# Patient Record
Sex: Female | Born: 1955
Health system: Southern US, Community
[De-identification: ages and names within clinical notes are randomized; demographics above are authoritative.]

## PROBLEM LIST (undated history)

## (undated) DIAGNOSIS — F329 Major depressive disorder, single episode, unspecified: Secondary | ICD-10-CM

## (undated) DIAGNOSIS — K219 Gastro-esophageal reflux disease without esophagitis: Secondary | ICD-10-CM

## (undated) DIAGNOSIS — R945 Abnormal results of liver function studies: Secondary | ICD-10-CM

## (undated) DIAGNOSIS — R7989 Other specified abnormal findings of blood chemistry: Secondary | ICD-10-CM

## (undated) DIAGNOSIS — K2270 Barrett's esophagus without dysplasia: Secondary | ICD-10-CM

## (undated) DIAGNOSIS — N289 Disorder of kidney and ureter, unspecified: Secondary | ICD-10-CM

## (undated) DIAGNOSIS — Z9889 Other specified postprocedural states: Secondary | ICD-10-CM

## (undated) DIAGNOSIS — Z9109 Other allergy status, other than to drugs and biological substances: Secondary | ICD-10-CM

## (undated) DIAGNOSIS — T4145XA Adverse effect of unspecified anesthetic, initial encounter: Secondary | ICD-10-CM

## (undated) DIAGNOSIS — R112 Nausea with vomiting, unspecified: Secondary | ICD-10-CM

## (undated) DIAGNOSIS — E559 Vitamin D deficiency, unspecified: Secondary | ICD-10-CM

## (undated) DIAGNOSIS — E119 Type 2 diabetes mellitus without complications: Secondary | ICD-10-CM

## (undated) DIAGNOSIS — N189 Chronic kidney disease, unspecified: Secondary | ICD-10-CM

## (undated) DIAGNOSIS — K259 Gastric ulcer, unspecified as acute or chronic, without hemorrhage or perforation: Secondary | ICD-10-CM

## (undated) DIAGNOSIS — T8859XA Other complications of anesthesia, initial encounter: Secondary | ICD-10-CM

## (undated) DIAGNOSIS — E785 Hyperlipidemia, unspecified: Secondary | ICD-10-CM

## (undated) HISTORY — PX: SHOULDER SURGERY: SHX246

## (undated) HISTORY — PX: OTHER SURGICAL HISTORY: SHX169

---

## 2012-07-10 ENCOUNTER — Other Ambulatory Visit: Payer: Self-pay | Admitting: Gastroenterology

## 2012-07-10 DIAGNOSIS — R1012 Left upper quadrant pain: Secondary | ICD-10-CM

## 2012-07-14 ENCOUNTER — Ambulatory Visit
Admission: RE | Admit: 2012-07-14 | Discharge: 2012-07-14 | Disposition: A | Discharge status: Home / Self Care | Payer: BC Managed Care – PPO | Source: Ambulatory Visit | Attending: Gastroenterology | Admitting: Gastroenterology

## 2012-07-14 DIAGNOSIS — R1012 Left upper quadrant pain: Secondary | ICD-10-CM

## 2012-07-14 MED ORDER — IOHEXOL 300 MG/ML  SOLN
100.0000 mL | Freq: Once | INTRAMUSCULAR | Status: AC | PRN
  Administered 2012-07-14: 100 mL via INTRAVENOUS

## 2014-09-03 ENCOUNTER — Inpatient Hospital Stay (HOSPITAL_COMMUNITY)
Admission: EM | Admit: 2014-09-03 | Discharge: 2014-09-05 | DRG: 601 | Disposition: A | Discharge status: Home / Self Care | Payer: 59 | Attending: General Surgery | Admitting: General Surgery

## 2014-09-03 ENCOUNTER — Emergency Department (HOSPITAL_COMMUNITY): Payer: 59 | Admitting: Certified Registered Nurse Anesthetist

## 2014-09-03 ENCOUNTER — Encounter (HOSPITAL_COMMUNITY): Admission: EM | Disposition: A | Discharge status: Home / Self Care | Payer: Self-pay | Source: Home / Self Care

## 2014-09-03 ENCOUNTER — Encounter (HOSPITAL_COMMUNITY): Payer: Self-pay | Admitting: Nurse Practitioner

## 2014-09-03 DIAGNOSIS — F329 Major depressive disorder, single episode, unspecified: Secondary | ICD-10-CM | POA: Diagnosis present

## 2014-09-03 DIAGNOSIS — E1165 Type 2 diabetes mellitus with hyperglycemia: Secondary | ICD-10-CM

## 2014-09-03 DIAGNOSIS — N611 Abscess of the breast and nipple: Secondary | ICD-10-CM | POA: Diagnosis present

## 2014-09-03 DIAGNOSIS — E559 Vitamin D deficiency, unspecified: Secondary | ICD-10-CM | POA: Diagnosis present

## 2014-09-03 DIAGNOSIS — IMO0002 Reserved for concepts with insufficient information to code with codable children: Secondary | ICD-10-CM

## 2014-09-03 DIAGNOSIS — K219 Gastro-esophageal reflux disease without esophagitis: Secondary | ICD-10-CM | POA: Diagnosis present

## 2014-09-03 DIAGNOSIS — E119 Type 2 diabetes mellitus without complications: Secondary | ICD-10-CM | POA: Diagnosis present

## 2014-09-03 DIAGNOSIS — N61 Inflammatory disorders of breast: Principal | ICD-10-CM | POA: Diagnosis present

## 2014-09-03 DIAGNOSIS — N189 Chronic kidney disease, unspecified: Secondary | ICD-10-CM | POA: Diagnosis present

## 2014-09-03 DIAGNOSIS — K5900 Constipation, unspecified: Secondary | ICD-10-CM | POA: Diagnosis present

## 2014-09-03 DIAGNOSIS — E785 Hyperlipidemia, unspecified: Secondary | ICD-10-CM | POA: Diagnosis present

## 2014-09-03 DIAGNOSIS — E1142 Type 2 diabetes mellitus with diabetic polyneuropathy: Secondary | ICD-10-CM

## 2014-09-03 DIAGNOSIS — K2270 Barrett's esophagus without dysplasia: Secondary | ICD-10-CM | POA: Diagnosis present

## 2014-09-03 DIAGNOSIS — R112 Nausea with vomiting, unspecified: Secondary | ICD-10-CM

## 2014-09-03 HISTORY — DX: Vitamin D deficiency, unspecified: E55.9

## 2014-09-03 HISTORY — DX: Nausea with vomiting, unspecified: R11.2

## 2014-09-03 HISTORY — DX: Other allergy status, other than to drugs and biological substances: Z91.09

## 2014-09-03 HISTORY — DX: Chronic kidney disease, unspecified: N18.9

## 2014-09-03 HISTORY — DX: Hyperlipidemia, unspecified: E78.5

## 2014-09-03 HISTORY — DX: Other specified abnormal findings of blood chemistry: R79.89

## 2014-09-03 HISTORY — DX: Adverse effect of unspecified anesthetic, initial encounter: T41.45XA

## 2014-09-03 HISTORY — DX: Major depressive disorder, single episode, unspecified: F32.9

## 2014-09-03 HISTORY — DX: Type 2 diabetes mellitus without complications: E11.9

## 2014-09-03 HISTORY — DX: Abnormal results of liver function studies: R94.5

## 2014-09-03 HISTORY — DX: Barrett's esophagus without dysplasia: K22.70

## 2014-09-03 HISTORY — DX: Other complications of anesthesia, initial encounter: T88.59XA

## 2014-09-03 HISTORY — DX: Gastric ulcer, unspecified as acute or chronic, without hemorrhage or perforation: K25.9

## 2014-09-03 HISTORY — DX: Other specified postprocedural states: Z98.890

## 2014-09-03 HISTORY — DX: Gastro-esophageal reflux disease without esophagitis: K21.9

## 2014-09-03 HISTORY — PX: IRRIGATION AND DEBRIDEMENT ABSCESS: SHX5252

## 2014-09-03 HISTORY — DX: Disorder of kidney and ureter, unspecified: N28.9

## 2014-09-03 LAB — COMPREHENSIVE METABOLIC PANEL
ALK PHOS: 105 U/L (ref 38–126)
ALT: 42 U/L (ref 14–54)
AST: 29 U/L (ref 15–41)
Albumin: 3.3 g/dL — ABNORMAL LOW (ref 3.5–5.0)
Anion gap: 8 (ref 5–15)
BILIRUBIN TOTAL: 0.8 mg/dL (ref 0.3–1.2)
BUN: 12 mg/dL (ref 6–20)
CHLORIDE: 100 mmol/L — AB (ref 101–111)
CO2: 26 mmol/L (ref 22–32)
CREATININE: 0.53 mg/dL (ref 0.44–1.00)
Calcium: 8.8 mg/dL — ABNORMAL LOW (ref 8.9–10.3)
GFR calc non Af Amer: >60 mL/min (ref >60)
GLUCOSE: 175 mg/dL — AB (ref 65–99)
Potassium: 3.7 mmol/L (ref 3.5–5.1)
Sodium: 134 mmol/L — ABNORMAL LOW (ref 135–145)
Total Protein: 7 g/dL (ref 6.5–8.1)

## 2014-09-03 LAB — CBC WITH DIFFERENTIAL/PLATELET
BASOS ABS: 0 10*3/uL (ref 0.0–0.1)
BASOS PCT: 0 % (ref 0–1)
Eosinophils Absolute: 0.2 10*3/uL (ref 0.0–0.7)
Eosinophils Relative: 2 % (ref 0–5)
HCT: 40.1 % (ref 36.0–46.0)
HEMOGLOBIN: 13.5 g/dL (ref 12.0–15.0)
LYMPHS PCT: 25 % (ref 12–46)
Lymphs Abs: 2.4 10*3/uL (ref 0.7–4.0)
MCH: 28.6 pg (ref 26.0–34.0)
MCHC: 33.7 g/dL (ref 30.0–36.0)
MCV: 85 fL (ref 78.0–100.0)
MONO ABS: 0.5 10*3/uL (ref 0.1–1.0)
MONOS PCT: 5 % (ref 3–12)
NEUTROS ABS: 6.3 10*3/uL (ref 1.7–7.7)
Neutrophils Relative %: 68 % (ref 43–77)
Platelets: 158 10*3/uL (ref 150–400)
RBC: 4.72 MIL/uL (ref 3.87–5.11)
RDW: 12.4 % (ref 11.5–15.5)
WBC: 9.4 10*3/uL (ref 4.0–10.5)

## 2014-09-03 LAB — GLUCOSE, CAPILLARY
Glucose-Capillary: 174 mg/dL — ABNORMAL HIGH (ref 65–99)
Glucose-Capillary: 168 mg/dL — ABNORMAL HIGH (ref 65–99)

## 2014-09-03 SURGERY — IRRIGATION AND DEBRIDEMENT ABSCESS
Anesthesia: General | Site: Breast | Laterality: Right

## 2014-09-03 MED ORDER — LACTATED RINGERS IV SOLN
INTRAVENOUS | Status: DC
  Administered 2014-09-03: 14:00:00 via INTRAVENOUS

## 2014-09-03 MED ORDER — FENTANYL CITRATE (PF) 100 MCG/2ML IJ SOLN
25.0000 ug | INTRAMUSCULAR | Status: DC | PRN
  Administered 2014-09-03 (×3): 50 ug via INTRAVENOUS

## 2014-09-03 MED ORDER — MENTHOL 3 MG MT LOZG
1.0000 | LOZENGE | OROMUCOSAL | Status: DC | PRN
  Administered 2014-09-03: 3 mg via ORAL
  Filled 2014-09-03: qty 9

## 2014-09-03 MED ORDER — LORATADINE 10 MG PO TABS
10.0000 mg | ORAL_TABLET | Freq: Every day | ORAL | Status: DC
  Administered 2014-09-03 – 2014-09-05 (×3): 10 mg via ORAL
  Filled 2014-09-03 (×3): qty 1

## 2014-09-03 MED ORDER — ROCURONIUM BROMIDE 50 MG/5ML IV SOLN
INTRAVENOUS | Status: AC
  Filled 2014-09-03: qty 1

## 2014-09-03 MED ORDER — ENOXAPARIN SODIUM 40 MG/0.4ML ~~LOC~~ SOLN
40.0000 mg | Freq: Every day | SUBCUTANEOUS | Status: DC
  Administered 2014-09-04 – 2014-09-05 (×2): 40 mg via SUBCUTANEOUS
  Filled 2014-09-03 (×2): qty 0.4

## 2014-09-03 MED ORDER — VANCOMYCIN HCL IN DEXTROSE 1-5 GM/200ML-% IV SOLN
1000.0000 mg | INTRAVENOUS | Status: AC
  Administered 2014-09-03: 1000 mg via INTRAVENOUS
  Filled 2014-09-03: qty 200

## 2014-09-03 MED ORDER — PROPOFOL 10 MG/ML IV BOLUS
INTRAVENOUS | Status: DC | PRN
  Administered 2014-09-03: 100 mg via INTRAVENOUS
  Administered 2014-09-03: 50 mg via INTRAVENOUS

## 2014-09-03 MED ORDER — ONDANSETRON HCL 4 MG/2ML IJ SOLN
4.0000 mg | INTRAMUSCULAR | Status: DC | PRN
  Administered 2014-09-03 – 2014-09-05 (×4): 4 mg via INTRAVENOUS
  Filled 2014-09-03 (×4): qty 2

## 2014-09-03 MED ORDER — LACTATED RINGERS IV SOLN
INTRAVENOUS | Status: DC | PRN
  Administered 2014-09-03: 15:00:00 via INTRAVENOUS

## 2014-09-03 MED ORDER — MIDAZOLAM HCL 5 MG/5ML IJ SOLN
INTRAMUSCULAR | Status: DC | PRN
  Administered 2014-09-03 (×2): 1 mg via INTRAVENOUS

## 2014-09-03 MED ORDER — ACETAMINOPHEN 650 MG RE SUPP: 650.0000 mg | Freq: Four times a day (QID) | RECTAL | Status: DC | PRN

## 2014-09-03 MED ORDER — FENTANYL CITRATE (PF) 250 MCG/5ML IJ SOLN
INTRAMUSCULAR | Status: AC
  Filled 2014-09-03: qty 5

## 2014-09-03 MED ORDER — 0.9 % SODIUM CHLORIDE (POUR BTL) OPTIME
TOPICAL | Status: DC | PRN
  Administered 2014-09-03: 1000 mL

## 2014-09-03 MED ORDER — FENTANYL CITRATE (PF) 100 MCG/2ML IJ SOLN
INTRAMUSCULAR | Status: AC
  Filled 2014-09-03: qty 2

## 2014-09-03 MED ORDER — OXYCODONE HCL 5 MG PO TABS
ORAL_TABLET | ORAL | Status: AC
  Filled 2014-09-03: qty 2

## 2014-09-03 MED ORDER — PROPOFOL 10 MG/ML IV BOLUS
INTRAVENOUS | Status: AC
  Filled 2014-09-03: qty 20

## 2014-09-03 MED ORDER — PIPERACILLIN-TAZOBACTAM 3.375 G IVPB
3.3750 g | INTRAVENOUS | Status: AC
  Administered 2014-09-03: 3.375 g via INTRAVENOUS
  Filled 2014-09-03: qty 50

## 2014-09-03 MED ORDER — OXYCODONE HCL 5 MG PO TABS
5.0000 mg | ORAL_TABLET | ORAL | Status: DC | PRN
  Administered 2014-09-03 – 2014-09-04 (×2): 10 mg via ORAL
  Administered 2014-09-05 (×2): 5 mg via ORAL
  Filled 2014-09-03 (×2): qty 1
  Filled 2014-09-03: qty 2

## 2014-09-03 MED ORDER — MIDAZOLAM HCL 2 MG/2ML IJ SOLN
INTRAMUSCULAR | Status: AC
  Filled 2014-09-03: qty 2

## 2014-09-03 MED ORDER — LIDOCAINE HCL (CARDIAC) 20 MG/ML IV SOLN
INTRAVENOUS | Status: AC
  Filled 2014-09-03: qty 10

## 2014-09-03 MED ORDER — ONDANSETRON HCL 4 MG/2ML IJ SOLN
INTRAMUSCULAR | Status: DC | PRN
  Administered 2014-09-03: 4 mg via INTRAVENOUS

## 2014-09-03 MED ORDER — ACETAMINOPHEN 325 MG PO TABS
650.0000 mg | ORAL_TABLET | Freq: Four times a day (QID) | ORAL | Status: DC | PRN
  Administered 2014-09-04 – 2014-09-05 (×3): 650 mg via ORAL
  Filled 2014-09-03 (×3): qty 2

## 2014-09-03 MED ORDER — LIDOCAINE HCL (CARDIAC) 20 MG/ML IV SOLN
INTRAVENOUS | Status: DC | PRN
  Administered 2014-09-03: 60 mg via INTRAVENOUS

## 2014-09-03 MED ORDER — FENTANYL CITRATE (PF) 100 MCG/2ML IJ SOLN
INTRAMUSCULAR | Status: DC | PRN
  Administered 2014-09-03: 100 ug via INTRAVENOUS

## 2014-09-03 MED ORDER — FENTANYL CITRATE (PF) 100 MCG/2ML IJ SOLN
100.0000 ug | Freq: Once | INTRAMUSCULAR | Status: AC
  Administered 2014-09-03: 100 ug via INTRAVENOUS
  Filled 2014-09-03: qty 2

## 2014-09-03 MED ORDER — VANCOMYCIN HCL IN DEXTROSE 1-5 GM/200ML-% IV SOLN
1000.0000 mg | INTRAVENOUS | Status: DC
  Administered 2014-09-04: 1000 mg via INTRAVENOUS
  Filled 2014-09-03 (×2): qty 200

## 2014-09-03 MED ORDER — PIPERACILLIN-TAZOBACTAM 3.375 G IVPB: 3.3750 g | INTRAVENOUS | Status: DC

## 2014-09-03 MED ORDER — SUCCINYLCHOLINE CHLORIDE 20 MG/ML IJ SOLN
INTRAMUSCULAR | Status: DC | PRN
  Administered 2014-09-03: 100 mg via INTRAVENOUS

## 2014-09-03 MED ORDER — ONDANSETRON HCL 4 MG/2ML IJ SOLN
INTRAMUSCULAR | Status: AC
  Filled 2014-09-03: qty 2

## 2014-09-03 MED ORDER — POTASSIUM CHLORIDE IN NACL 20-0.9 MEQ/L-% IV SOLN
INTRAVENOUS | Status: DC
  Administered 2014-09-03 – 2014-09-04 (×2): via INTRAVENOUS
  Filled 2014-09-03 (×6): qty 1000

## 2014-09-03 MED ORDER — PIPERACILLIN-TAZOBACTAM 3.375 G IVPB
3.3750 g | Freq: Three times a day (TID) | INTRAVENOUS | Status: DC
  Administered 2014-09-03 – 2014-09-05 (×5): 3.375 g via INTRAVENOUS
  Filled 2014-09-03 (×7): qty 50

## 2014-09-03 MED ORDER — MORPHINE SULFATE 2 MG/ML IJ SOLN
1.0000 mg | INTRAMUSCULAR | Status: DC | PRN
  Administered 2014-09-03 – 2014-09-04 (×4): 2 mg via INTRAVENOUS
  Filled 2014-09-03 (×4): qty 1

## 2014-09-03 SURGICAL SUPPLY — 30 items
BNDG GAUZE ELAST 4 BULKY (GAUZE/BANDAGES/DRESSINGS) IMPLANT
CANISTER SUCTION 2500CC (MISCELLANEOUS) ×2 IMPLANT
COVER SURGICAL LIGHT HANDLE (MISCELLANEOUS) ×2 IMPLANT
DRAPE CHEST BREAST 15X10 FENES (DRAPES) ×2 IMPLANT
DRAPE LAPAROSCOPIC ABDOMINAL (DRAPES) IMPLANT
DRAPE PED LAPAROTOMY (DRAPES) IMPLANT
DRAPE UTILITY XL STRL (DRAPES) IMPLANT
DRSG PAD ABDOMINAL 8X10 ST (GAUZE/BANDAGES/DRESSINGS) IMPLANT
ELECT CAUTERY BLADE 6.4 (BLADE) ×2 IMPLANT
ELECT REM PT RETURN 9FT ADLT (ELECTROSURGICAL) ×2
ELECTRODE REM PT RTRN 9FT ADLT (ELECTROSURGICAL) ×1 IMPLANT
GAUZE PACKING IODOFORM 1/4X15 (GAUZE/BANDAGES/DRESSINGS) ×2 IMPLANT
GAUZE SPONGE 4X4 12PLY STRL (GAUZE/BANDAGES/DRESSINGS) ×2 IMPLANT
GLOVE BIOGEL PI IND STRL 7.0 (GLOVE) ×1 IMPLANT
GLOVE BIOGEL PI IND STRL 8 (GLOVE) ×1 IMPLANT
GLOVE BIOGEL PI INDICATOR 7.0 (GLOVE) ×1
GLOVE BIOGEL PI INDICATOR 8 (GLOVE) ×1
GLOVE ECLIPSE 7.5 STRL STRAW (GLOVE) ×2 IMPLANT
GLOVE ECLIPSE 8.0 STRL XLNG CF (GLOVE) IMPLANT
GOWN STRL REUS W/ TWL LRG LVL3 (GOWN DISPOSABLE) ×2 IMPLANT
GOWN STRL REUS W/TWL LRG LVL3 (GOWN DISPOSABLE) ×2
KIT BASIN OR (CUSTOM PROCEDURE TRAY) ×6 IMPLANT
KIT ROOM TURNOVER OR (KITS) ×2 IMPLANT
NS IRRIG 1000ML POUR BTL (IV SOLUTION) ×2 IMPLANT
PACK GENERAL/GYN (CUSTOM PROCEDURE TRAY) ×2 IMPLANT
PAD ARMBOARD 7.5X6 YLW CONV (MISCELLANEOUS) ×2 IMPLANT
SWAB COLLECTION DEVICE MRSA (MISCELLANEOUS) ×2 IMPLANT
TOWEL OR 17X24 6PK STRL BLUE (TOWEL DISPOSABLE) ×2 IMPLANT
TOWEL OR 17X26 10 PK STRL BLUE (TOWEL DISPOSABLE) IMPLANT
TUBE ANAEROBIC SPECIMEN COL (MISCELLANEOUS) ×2 IMPLANT

## 2014-09-03 NOTE — Anesthesia Postprocedure Evaluation (Signed)
  Anesthesia Post-op Note  Patient: Julia Shah  Procedure(s) Performed: Procedure(s): IRRIGATION AND DEBRIDEMENT RIGHT BREAST ABSCESS (Right)  Patient Location: PACU  Anesthesia Type:General  Level of Consciousness: awake  Airway and Oxygen Therapy: Patient Spontanous Breathing  Post-op Pain: mild  Post-op Assessment: Post-op Vital signs reviewed  Post-op Vital Signs: Reviewed  Last Vitals:  Filed Vitals:   09/03/14 1300  BP: 128/75  Pulse: 99  Temp:   Resp:     Complications: No apparent anesthesia complications

## 2014-09-03 NOTE — Progress Notes (Signed)
Pt from ED placed on tele.

## 2014-09-03 NOTE — ED Notes (Signed)
General surgery team at bedside assessing pt and explaining procedure. Consent obtained.

## 2014-09-03 NOTE — ED Provider Notes (Signed)
CSN: [id number]     Arrival date & time 09/03/14  0935 History   First MD Initiated Contact with Patient 09/03/14 [phone]     Chief Complaint  Patient presents with  . Cellulitis      HPI  Expand All Collapse All   Pt sts has had redness and swelling in right breast that started February and seemed to resolve on its own in a few weeks. Pt sts noticed right breast redness, swelling, pain started again at the end of March. Redness, swelling increased, breast became very tender without drainage, hot to touch. Pt saw PCP 5/7 and was Rx ointment and On 5/24 clindamycin PO. Pt saw PCP today and was sent to ER for further evaluation.        Past Medical History  Diagnosis Date  . Vitamin D deficiency disease   . Gastric ulcer   . GERD (gastroesophageal reflux disease)   . Abnormal liver function test   . Pollen allergies   . MDD (major depressive disorder)   . Barrett esophagus   . Renal disorder   . Chronic kidney disease   . Diabetes mellitus without complication   . Hyperlipidemia   . Complication of anesthesia   . PONV (postoperative nausea and vomiting)    Past Surgical History  Procedure Laterality Date  . Foot surgery Left   . Shoulder surgery    . Irrigation and debridement abscess Left 09/03/2014    left breast  . Irrigation and debridement abscess Right 09/03/2014    Procedure: IRRIGATION AND DEBRIDEMENT RIGHT BREAST ABSCESS;  Surgeon: Jimmye NormanJames Wyatt, MD;  [facility];  Service: General;  Laterality: Right;   History reviewed. No pertinent family history. History  Substance Use Topics  . Smoking status: Never Smoker   . Smokeless tobacco: Never Used  . Alcohol Use: No   OB History    No data available     Review of Systems  All other systems reviewed and are negative  Allergies  Benadryl  Home Medications   Prior to Admission medications   Medication Sig Start Date End Date Taking? Authorizing Provider  clindamycin (CLEOCIN) 300 MG capsule Take 300 mg by  mouth 4 (four) times daily. Started on 09-01-14 for 7 days   Yes Historical Provider, MD  Empagliflozin-Metformin HCl 08-998 MG TABS Take 0.5 tablets by mouth daily as needed. Unsure of indication   Yes Historical Provider, MD  doxycycline (VIBRA-TABS) 100 MG tablet Take 1 tablet (100 mg total) by mouth every 12 (twelve) hours. 09/05/14   Claud KelpHaywood Ingram, MD  oxyCODONE (OXY IR/ROXICODONE) 5 MG immediate release tablet Take 1-2 tablets (5-10 mg total) by mouth every 4 (four) hours as needed for moderate pain. 09/05/14   Claud KelpHaywood Ingram, MD   BP 132/80 mmHg  Pulse 98  Temp(Src) 98.4 F (36.9 C) (Oral)  Resp 14  Ht 5\' 3"  (1.6 m)  Wt 120 lb (54.432 kg)  BMI 21.26 kg/m2  SpO2 100% Physical Exam  Constitutional: She is oriented to person, place, and time. She appears well-developed and well-nourished. No distress.  HENT:  Head: Normocephalic and atraumatic.  Eyes: Pupils are equal, round, and reactive to light.  Neck: Normal range of motion.  Cardiovascular: Normal rate and intact distal pulses.   Pulmonary/Chest: No respiratory distress.    Abdominal: Normal appearance. She exhibits no distension.  Musculoskeletal: Normal range of motion.  Neurological: She is alert and oriented to person, place, and time. No cranial nerve deficit.  Skin: Skin  is warm and dry. No rash noted.  Psychiatric: She has a normal mood and affect. Her behavior is normal.  Nursing note and vitals reviewed.   ED Course  Procedures (including critical care time) Labs Review Labs Reviewed  COMPREHENSIVE METABOLIC PANEL - Abnormal; Notable for the following:    Sodium 134 (*)    Chloride 100 (*)    Glucose, Bld 175 (*)    Calcium 8.8 (*)    Albumin 3.3 (*)    All other components within normal limits  GLUCOSE, CAPILLARY - Abnormal; Notable for the following:    Glucose-Capillary 174 (*)    All other components within normal limits  GLUCOSE, CAPILLARY - Abnormal; Notable for the following:    Glucose-Capillary  168 (*)    All other components within normal limits  BASIC METABOLIC PANEL - Abnormal; Notable for the following:    Chloride 100 (*)    Glucose, Bld 165 (*)    Calcium 8.4 (*)    All other components within normal limits  HEMOGLOBIN A1C - Abnormal; Notable for the following:    Hgb A1c MFr Bld 13.3 (*)    All other components within normal limits  GLUCOSE, CAPILLARY - Abnormal; Notable for the following:    Glucose-Capillary 237 (*)    All other components within normal limits  GLUCOSE, CAPILLARY - Abnormal; Notable for the following:    Glucose-Capillary 242 (*)    All other components within normal limits  GLUCOSE, CAPILLARY - Abnormal; Notable for the following:    Glucose-Capillary 184 (*)    All other components within normal limits  GLUCOSE, CAPILLARY - Abnormal; Notable for the following:    Glucose-Capillary 198 (*)    All other components within normal limits  CULTURE, BLOOD (ROUTINE X 2)  CULTURE, BLOOD (ROUTINE X 2)  CULTURE, ROUTINE-ABSCESS  ANAEROBIC CULTURE  URINE CULTURE  CBC WITH DIFFERENTIAL/PLATELET  CBC    Imaging Review No results found.  Surgery consulted and came to the emergency department saw the patient  MDM   Final diagnoses:  Cellulitis        Nelva Nay, MD 09/15/14 [phone]

## 2014-09-03 NOTE — ED Notes (Signed)
Pt in restroom 

## 2014-09-03 NOTE — Transfer of Care (Signed)
Immediate Anesthesia Transfer of Care Note  Patient: Julia Shah  Procedure(s) Performed: Procedure(s): IRRIGATION AND DEBRIDEMENT RIGHT BREAST ABSCESS (Right)  Patient Location: PACU  Anesthesia Type:General  Level of Consciousness: sedated  Airway & Oxygen Therapy: Patient Spontanous Breathing and Patient connected to nasal cannula oxygen  Post-op Assessment: Report given to RN, Post -op Vital signs reviewed and stable and Patient moving all extremities X 4  Post vital signs: Reviewed and stable  Last Vitals:  Filed Vitals:   09/03/14 1300  BP: 128/75  Pulse: 99  Temp:   Resp:   Hr 98, BP 135/78, RR 18, Sats 98% on 2L   Complications: No apparent anesthesia complications

## 2014-09-03 NOTE — Anesthesia Procedure Notes (Addendum)
Procedure Name: Intubation Date/Time: 09/03/2014 2:41 PM Performed by: Glo HerringLEE, Sonnie Pawloski B Pre-anesthesia Checklist: Patient identified, Timeout performed, Emergency Drugs available, Suction available and Patient being monitored Patient Re-evaluated:Patient Re-evaluated prior to inductionOxygen Delivery Method: Circle system utilized Preoxygenation: Pre-oxygenation with 100% oxygen Intubation Type: IV induction and Cricoid Pressure applied Laryngoscope Size: Mac and 3 Grade View: Grade I Tube type: Oral Tube size: 7.5 mm Number of attempts: 2 Airway Equipment and Method: Video-laryngoscopy and Stylet Placement Confirmation: ETT inserted through vocal cords under direct vision,  positive ETCO2,  CO2 detector and breath sounds checked- equal and bilateral Secured at: 21 cm Tube secured with: Tape Dental Injury: Teeth and Oropharynx as per pre-operative assessment  Difficulty Due To: Difficulty was unanticipated and Difficult Airway- due to anterior larynx Comments: DLx1 with MAC 3 - grade 4 view.  Cricoid pressure maintained and light ventilation with oral airway.  DLX1 with glidescope - grade 1 view.

## 2014-09-03 NOTE — Progress Notes (Signed)
Pharmacy in states they will contact Barnetta ChapelKelly Osborne, PA that Iv vancomycin was started in ED.

## 2014-09-03 NOTE — Op Note (Signed)
OPERATIVE REPORT  DATE OF OPERATION: 09/03/2014  PATIENT:  Julia Shah  59 y.o. female  PRE-OPERATIVE DIAGNOSIS:  RIGHT BREAST ABCESS  POST-OPERATIVE DIAGNOSIS:  RIGHT BREAST ABCESS  PROCEDURE:  Procedure(s): IRRIGATION AND DEBRIDEMENT RIGHT BREAST ABSCESS  SURGEON:  Surgeon(s): Jimmye NormanJames Henessy Rohrer, MD  ASSISTANT: None  ANESTHESIA:   general  EBL: <10 ml  BLOOD ADMINISTERED: none  DRAINS: Wound packed with 2/3 bottle of 1/4 inche Iodoform NuGauze   SPECIMEN:  Source of Specimen:  Aerobic and anaerobic cultures  COUNTS CORRECT:  YES  PROCEDURE DETAILS: The patient was taken to the operating room and placed on the table in the supine position. After an adequate general endotracheal anesthesia was administered she was prepped and draped in usual sterile manner exposing her right breast which have been marked preoperatively by the surgeon.  A proper timeout was performed identifying the patient and procedure to be performed. The abscess cavity had started to drain spontaneously prior to the incision.  A hemostat clamp was placed in the cavity and we incised between the arms of the clamp using a 15 blade and electrocautery in order to enlarge the drainage cavity. We subsequently performed aerobic and anaerobic cultures.  The wound was irrigated with saline syringe using a bulb syringe and then we packed it with 1/4 inch iodoform Nu Gauze. Approximately two thirds of about a were used.  All needle counts, sponge counts, and instrument counts were correct. A sterile dressing was applied.  PATIENT DISPOSITION:  PACU - hemodynamically stable.   Oskar Cretella 5/27/20163:10 PM

## 2014-09-03 NOTE — Anesthesia Preprocedure Evaluation (Addendum)
Anesthesia Evaluation  Patient identified by MRN, date of birth, ID band Patient awake    Reviewed: Allergy & Precautions, NPO status , Patient's Chart, lab work & pertinent test results  Airway Mallampati: II  TM Distance: >3 FB Neck ROM: Full    Dental   Pulmonary neg pulmonary ROS,  breath sounds clear to auscultation        Cardiovascular negative cardio ROS  Rhythm:Regular Rate:Normal     Neuro/Psych    GI/Hepatic PUD, GERD-  ,  Endo/Other  diabetes  Renal/GU Renal disease     Musculoskeletal   Abdominal   Peds  Hematology   Anesthesia Other Findings   Reproductive/Obstetrics                           Anesthesia Physical Anesthesia Plan  ASA: III  Anesthesia Plan: General   Post-op Pain Management:    Induction: Intravenous  Airway Management Planned: Oral ETT  Additional Equipment:   Intra-op Plan:   Post-operative Plan: Extubation in OR  Informed Consent: I have reviewed the patients History and Physical, chart, labs and discussed the procedure including the risks, benefits and alternatives for the proposed anesthesia with the patient or authorized representative who has indicated his/her understanding and acceptance.   Dental advisory given  Plan Discussed with: CRNA and Anesthesiologist  Anesthesia Plan Comments:         Anesthesia Quick Evaluation

## 2014-09-03 NOTE — Progress Notes (Signed)
ANTIBIOTIC CONSULT NOTE - INITIAL  Pharmacy Consult for vanc Indication: Cellulitis  Allergies  Allergen Reactions  . Benadryl [Diphenhydramine Hcl (Sleep)]     Trouble voiding    Patient Measurements: Height: 5\' 3"  (160 cm) Weight: 120 lb (54.432 kg) IBW/kg (Calculated) : 52.4 Adjusted Body Weight:   Vital Signs: Temp: 98 F (36.7 C) (05/27 1522) Temp Source: Oral (05/27 1000) BP: 143/78 mmHg (05/27 1600) Pulse Rate: 91 (05/27 1608) Intake/Output from previous day:   Intake/Output from this shift: Total I/O In: 600 [I.V.:600] Out: 10 [Blood:10]  Labs:  Recent Labs  09/03/14 1037  WBC 9.4  HGB 13.5  PLT 158  CREATININE 0.53   Estimated Creatinine Clearance: 64.2 mL/min (by C-G formula based on Cr of 0.53). No results for input(s): VANCOTROUGH, VANCOPEAK, VANCORANDOM, GENTTROUGH, GENTPEAK, GENTRANDOM, TOBRATROUGH, TOBRAPEAK, TOBRARND, AMIKACINPEAK, AMIKACINTROU, AMIKACIN in the last 72 hours.   Microbiology: No results found for this or any previous visit (from the past 720 hour(s)).  Medical History: Past Medical History  Diagnosis Date  . Vitamin D deficiency disease   . Gastric ulcer   . GERD (gastroesophageal reflux disease)   . Abnormal liver function test   . Pollen allergies   . MDD (major depressive disorder)   . Barrett esophagus   . Renal disorder   . Chronic kidney disease   . Diabetes mellitus without complication   . Hyperlipidemia     Medications:  Scheduled:  . [START ON 09/04/2014] enoxaparin (LOVENOX) injection  40 mg Subcutaneous Daily  . piperacillin-tazobactam (ZOSYN)  IV  3.375 g Intravenous 3 times per day   Infusions:  . 0.9 % NaCl with KCl 20 mEq / L     Assessment: 59 yo who came in with an breast abscess that was started in Feb? She was on 2 days of clinda prior to admission. She was taken to OR for I&D. They started her on vanc/zosyn while in the OR so it won't interfere with culture results.   Goal of Therapy:   Vancomycin trough level 10-15 mcg/ml  Plan:   Vanc 1g IV q24 Consider using the cellulitis orderset Trough in a few days  Ulyses SouthwardMinh Pham, PharmD Pager: [phone] 09/03/2014 4:42 PM

## 2014-09-03 NOTE — ED Notes (Signed)
Pt sts has had redness and swelling in right breast that started February and seemed to resolve on its own in a few weeks. Pt sts noticed right breast redness, swelling, pain started again at the end of March. Redness, swelling increased, breast became very tender without drainage, hot to touch. Pt saw PCP 5/7 and was Rx ointment and  On 5/24 clindamycin PO. Pt saw PCP today and was sent to ER for further evaluation.   Pt sts last mammogram was 09/2012- was clear. Pt has diabetes.

## 2014-09-03 NOTE — H&P (Signed)
Julia Shah is an 59 y.o. Shah.   Chief Complaint: right breast abscess HPI: This is a 59 yo Julia Shah who has been in the [place] since 1981.  She has been given pain medicine current and so her history is not reliable.  The EDP states she has been on clindamycin for 2 days from her PCP, but she couldn't remember when we asked her.  She states that this abscess started in February.  The rest of her history is very unreliable and difficult to obtain due to confusion from her medication.  It sounds like this has been a problem on and off since February.  She denies fevers, but admits to chills.  This current episode worsened around 2 days ago.  She presented to the [facility] today for further evaluation.  We have been asked to see her.  Past Medical History  Diagnosis Date  . Vitamin D deficiency disease   . Gastric ulcer   . GERD (gastroesophageal reflux disease)   . Abnormal liver function test   . Pollen allergies   . MDD (major depressive disorder)   . Barrett esophagus   . Renal disorder   . Chronic kidney disease   . Diabetes mellitus without complication   . Hyperlipidemia     Past Surgical History  Procedure Laterality Date  . Foot surgery Left   . Shoulder surgery      History reviewed. No pertinent family history. Social History:  reports that she has never smoked. She does not have any smokeless tobacco history on file. She reports that she does not drink alcohol or use illicit drugs.  Allergies:  Allergies  Allergen Reactions  . Benadryl [Diphenhydramine Hcl (Sleep)]     Trouble voiding     (Not in a hospital admission)  Results for orders placed or performed during the hospital encounter of 09/03/14 (from the past 48 hour(s))  CBC with Differential/Platelet     Status: None   Collection Time: 09/03/14 10:37 AM  Result Value Ref Range   WBC 9.4 4.0 - 10.5 K/uL   RBC 4.72 3.87 - 5.11 MIL/uL   Hemoglobin 13.5 12.0 - 15.0 g/dL   HCT 40.1 36.0 - 46.0 %   MCV 85.0  78.0 - 100.0 fL   MCH 28.6 26.0 - 34.0 pg   MCHC 33.7 30.0 - 36.0 g/dL   RDW 12.4 11.5 - 15.5 %   Platelets 158 150 - 400 K/uL   Neutrophils Relative % 68 43 - 77 %   Neutro Abs 6.3 1.7 - 7.7 K/uL   Lymphocytes Relative 25 12 - 46 %   Lymphs Abs 2.4 0.7 - 4.0 K/uL   Monocytes Relative 5 3 - 12 %   Monocytes Absolute 0.5 0.1 - 1.0 K/uL   Eosinophils Relative 2 0 - 5 %   Eosinophils Absolute 0.2 0.0 - 0.7 K/uL   Basophils Relative 0 0 - 1 %   Basophils Absolute 0.0 0.0 - 0.1 K/uL  Comprehensive metabolic panel     Status: Abnormal   Collection Time: 09/03/14 10:37 AM  Result Value Ref Range   Sodium 134 (L) 135 - 145 mmol/L   Potassium 3.7 3.5 - 5.1 mmol/L   Chloride 100 (L) 101 - 111 mmol/L   CO2 26 22 - 32 mmol/L   Glucose, Bld 175 (H) 65 - 99 mg/dL   BUN 12 6 - 20 mg/dL   Creatinine, Ser 0.53 0.44 - 1.00 mg/dL   Calcium 8.8 (L) 8.9 -  10.3 mg/dL   Total Protein 7.0 6.5 - 8.1 g/dL   Albumin 3.3 (L) 3.5 - 5.0 g/dL   AST 29 15 - 41 U/L   ALT 42 14 - 54 U/L   Alkaline Phosphatase 105 38 - 126 U/L   Total Bilirubin 0.8 0.3 - 1.2 mg/dL   GFR calc non Af Amer >60 >60 mL/min   GFR calc Af Amer >60 >60 mL/min    Comment: (NOTE) The eGFR has been calculated using the CKD EPI equation. This calculation has not been validated in all clinical situations. eGFR's persistently <60 mL/min signify possible Chronic Kidney Disease.    Anion gap 8 5 - 15   No results found.  Review of Systems  Unable to perform ROS: language  Constitutional: Positive for chills. Negative for fever and weight loss.  Skin: Positive for itching.  see HPI, otherwise unable to obtain due to mental confusion  Blood pressure 128/75, pulse 99, temperature 99 F (37.2 C), temperature source Oral, resp. rate 18, height '5\' 3"'  (1.6 m), weight 54.432 kg (120 lb), SpO2 97 %. Physical Exam  Constitutional: She appears well-developed and well-nourished.  HENT:  Head: Normocephalic and atraumatic.  Nose: Nose  normal.   General: somewhat confused Julia Shah who is laying in bed in NAD HEENT: head is normocephalic, atraumatic.  Sclera are noninjected.  PERRL.  Ears and nose without any masses or lesions.  Mouth is pink and moist Heart: regular, rate, and rhythm.  Normal s1,s2. No obvious murmurs, gallops, or rubs noted.  Palpable radial and pedal pulses bilaterally Lungs: CTAB, no wheezes, rhonchi, or rales noted.  Respiratory effort nonlabored Chest: right breast with cellulitis encompassing the entire portion of her right inferior breast up to her areola.  She has a central area of fluctuance that measure about 2x2cm, but cellulitis is more extensive.  She has several small mm size pustules noted around the inferior portion of her breast as well.  No nipple discharge.  No inversion of her nipple.  No peau de orange.   Abd: soft, NT, ND, +BS, no masses, hernias, or organomegaly MS: all 4 extremities are symmetrical with no cyanosis, clubbing, or edema. Skin: warm and dry with no masses, lesions, or rashes Psych: A&Ox3 with an appropriate affect.   Assessment/Plan 1. Right breast abscess -to OR for I&D -due to patient's confusion secondary to medications, we have explained all of this to her husband via and interpretor in order for him to sign her consent. -they both are agreeable -NPO -will start vanc and zosyn once patient gets in OR to not interfere with culture results 2. DM -resume home meds when eating after surgery   Everlie Eble E 09/03/2014, 1:18 PM

## 2014-09-04 ENCOUNTER — Encounter (HOSPITAL_COMMUNITY): Payer: Self-pay | Admitting: General Surgery

## 2014-09-04 ENCOUNTER — Inpatient Hospital Stay (HOSPITAL_COMMUNITY): Payer: 59

## 2014-09-04 LAB — BASIC METABOLIC PANEL
ANION GAP: 11 (ref 5–15)
BUN: 10 mg/dL (ref 6–20)
CALCIUM: 8.4 mg/dL — AB (ref 8.9–10.3)
CO2: 25 mmol/L (ref 22–32)
Chloride: 100 mmol/L — ABNORMAL LOW (ref 101–111)
Creatinine, Ser: 0.54 mg/dL (ref 0.44–1.00)
GLUCOSE: 165 mg/dL — AB (ref 65–99)
Potassium: 4.2 mmol/L (ref 3.5–5.1)
Sodium: 136 mmol/L (ref 135–145)

## 2014-09-04 LAB — CBC
HCT: 39.1 % (ref 36.0–46.0)
HEMOGLOBIN: 12.7 g/dL (ref 12.0–15.0)
MCH: 27.9 pg (ref 26.0–34.0)
MCHC: 32.5 g/dL (ref 30.0–36.0)
MCV: 85.9 fL (ref 78.0–100.0)
Platelets: 158 10*3/uL (ref 150–400)
RBC: 4.55 MIL/uL (ref 3.87–5.11)
RDW: 12.4 % (ref 11.5–15.5)
WBC: 8.4 10*3/uL (ref 4.0–10.5)

## 2014-09-04 LAB — GLUCOSE, CAPILLARY
GLUCOSE-CAPILLARY: 237 mg/dL — AB (ref 65–99)
GLUCOSE-CAPILLARY: 242 mg/dL — AB (ref 65–99)

## 2014-09-04 MED ORDER — INSULIN ASPART 100 UNIT/ML ~~LOC~~ SOLN
0.0000 [IU] | Freq: Three times a day (TID) | SUBCUTANEOUS | Status: DC
  Administered 2014-09-04: 5 [IU] via SUBCUTANEOUS
  Administered 2014-09-05 (×2): 3 [IU] via SUBCUTANEOUS

## 2014-09-04 MED ORDER — WHITE PETROLATUM GEL
Status: AC
  Administered 2014-09-04: 0.2
  Filled 2014-09-04: qty 1

## 2014-09-04 MED ORDER — INSULIN ASPART 100 UNIT/ML ~~LOC~~ SOLN
0.0000 [IU] | Freq: Every day | SUBCUTANEOUS | Status: DC
  Administered 2014-09-04: 2 [IU] via SUBCUTANEOUS

## 2014-09-04 MED ORDER — POLYETHYLENE GLYCOL 3350 17 G PO PACK
17.0000 g | PACK | Freq: Three times a day (TID) | ORAL | Status: DC
  Administered 2014-09-04 – 2014-09-05 (×3): 17 g via ORAL
  Filled 2014-09-04 (×4): qty 1

## 2014-09-04 MED ORDER — HYDROMORPHONE HCL 1 MG/ML IJ SOLN
1.0000 mg | INTRAMUSCULAR | Status: DC | PRN
  Administered 2014-09-04 – 2014-09-05 (×2): 1 mg via INTRAVENOUS
  Filled 2014-09-04 (×2): qty 1

## 2014-09-04 NOTE — Care Management Note (Addendum)
Case Management Note  Patient Details  Name: Julia Shah MRN: [id number] Date of Birth: 1956/03/06  Subjective/Objective:                   Right breast abscess, s/p I & D.   Action/Plan:   D/c Planning.   Expected Discharge Date: 09/07/14               Expected Discharge Plan:  Home w Home Health Services  In-House Referral:  Clinical Social Work, Orthoptist  Discharge planning Services     Post Acute Care Choice:    Choice offered to:     DME Arranged:    DME Agency:     HH Arranged:    HH Agency:     Status of Service:  In process, will continue to follow  Medicare Important Message Given:    Date Medicare IM Given:    Medicare IM give by:    Date Additional Medicare IM Given:    Additional Medicare Important Message give by:     If discussed at Long Length of Stay Meetings, dates discussed:    Additional Comments: Received CM consult for medications.  Spoke with pt. & family (pt's daughter Belia Febo, son Kadee Philyaw, sister-in-law and nephew).   Received verbal permission from patient to speak with family.  Patient currently lives in [place], [place] alone with intermittent visits from her husband every 2 - 3 nights.  Patient has been having trouble driving due to eye sight and energy level.   Pt's husband works out of town in [place] and stays with his sister in [place] when he is not at home in [place] or working.  Pt's husband has a language barrier (speaks primarily [place]) and does not understand English very well per patient's family.   Patient's daughter Iley Deignan lives in [place], [place].   Patient's son Lashika Erker lives in [place], [place].   Patient's health has had increase decline over the last 3 months and they are wondering if patient will be able to continue to live alone upon d/c.  Discussed d/c planning options with family and they have decided that patient will be d/c'd to sister-in-law home ( [address] [place], [address], [place] [address], phone  [phone] ) with intermittent assistance/supervision.  Patient will be alone at her sister-in-laws house during the day (6am - 4pm) while everyone is at work and will have 24 hour supervision at night.   Patient wants to know the cost of her daily hospital stay, advised that she may request an itemized bill after d/c.   Patient has not had a problem paying for meds in the past but wants to know what benefit level will be for [facility].   Advised CMA will check her benefits on Tuesday when the insurance company reopens.  Patient also wishes to obtain HPOA while she is in the hospital.  Patient wants the medical staff to be able to talk with her children Byrd Hesselbach & Onalee Hua) regarding her care.   CM contacted [facility] SW regarding pt's language barrier and her request for HPOA while inpatient.  CM spoke with patient's nurse  regarding order for SW consult for community resources/ HPOA and Chaplain consult for HPOA.    Also advised nurse that patient wishes to have daughter Maurene Hollin [phone]) and son Loany Neuroth [phone]) added to list of people that can discuss patient's health information with the hospital and hospital can talk to  them about patient's condition.  No further CM needs at this time.    Shelda PalCooper, Angelle Isais H, RN 09/04/2014, 5:00 PM

## 2014-09-04 NOTE — Progress Notes (Signed)
1 Day Post-Op  Subjective: Stable and alert. Daughter from [facility] is here at bedside. Patient has lots of complaints. States that she has a headache. States that she vomited. States that she has some abdominal discomfort. States that she's having urinary urgency. Transient right leg pain.  Afebrile. Vital signs stable. Good urine output. Breast abscess cultures no growth at this time. Gram stain apparently negative. Hemoglobin 12.7. WBC 8400. Potassium 4.2. Creatinine 0.54. Glucose 165.  Abdominal x-rays this morning show lots of stool throughout the colon. No obstruction   Objective: Vital signs in last 24 hours: Temp:  [98 F (36.7 C)-99 F (37.2 C)] 98.8 F (37.1 C) (05/28 0529) Pulse Rate:  [90-103] 100 (05/28 0529) Resp:  [14-20] 17 (05/28 0529) BP: (96-143)/(58-91) 117/58 mmHg (05/28 0529) SpO2:  [97 %-100 %] 98 % (05/28 0529) Weight:  [54.432 kg (120 lb)] 54.432 kg (120 lb) (05/27 1000) Last BM Date: 09/02/14  Intake/Output from previous day: 05/27 0701 - 05/28 0700 In: 840 [P.O.:240; I.V.:600] Out: 1060 [Urine:1050; Blood:10] Intake/Output this shift:    General appearance: Does not seem to be in any physical distress, except when I change the breast bandage. Keep  eyes shut most of the time. Seems somewhat withdrawn. Will open eyes and answers questions appropriately. Asking lots of questions about her symptoms. Resp: clear to auscultation bilaterally Breasts:   Right breast wound shows erythema. Somewhat tender. Packing removed. No odor or purulence. No bleeding. No skin necrosis. Nursing staff to re-pack with iodoform when available. Re-dressed with 4 x 4's. GI: Abdomen is soft. Nontender. Nondistended. Active bowel sounds. Bladder does not appear distended. Extremities: Moves lower extremities well to active and passive range of motion without discomfort. No tenderness of thigh  or calf muscles. Homans sign negative. No cords. No signs of infection. No swelling.  Good pulses. Normal exam.  Lab Results:  Results for orders placed or performed during the hospital encounter of 09/03/14 (from the past 24 hour(s))  Culture, blood (routine x 2)     Status: None (Preliminary result)   Collection Time: 09/03/14 10:24 AM  Result Value Ref Range   Specimen Description BLOOD RIGHT FOREARM    Special Requests BOTTLES DRAWN AEROBIC AND ANAEROBIC 5CC    Culture             BLOOD CULTURE RECEIVED NO GROWTH TO DATE CULTURE WILL BE HELD FOR 5 DAYS BEFORE ISSUING A FINAL NEGATIVE REPORT Performed at [facility]    Report Status PENDING   Culture, blood (routine x 2)     Status: None (Preliminary result)   Collection Time: 09/03/14 10:35 AM  Result Value Ref Range   Specimen Description BLOOD ARM RIGHT    Special Requests BOTTLES DRAWN AEROBIC AND ANAEROBIC 10CC    Culture             BLOOD CULTURE RECEIVED NO GROWTH TO DATE CULTURE WILL BE HELD FOR 5 DAYS BEFORE ISSUING A FINAL NEGATIVE REPORT Performed at [facility]    Report Status PENDING   CBC with Differential/Platelet     Status: None   Collection Time: 09/03/14 10:37 AM  Result Value Ref Range   WBC 9.4 4.0 - 10.5 K/uL   RBC 4.72 3.87 - 5.11 MIL/uL   Hemoglobin 13.5 12.0 - 15.0 g/dL   HCT 16.1 09.6 - 04.5 %   MCV 85.0 78.0 - 100.0 fL   MCH 28.6 26.0 - 34.0 pg   MCHC 33.7 30.0 - 36.0 g/dL  RDW 12.4 11.5 - 15.5 %   Platelets 158 150 - 400 K/uL   Neutrophils Relative % 68 43 - 77 %   Neutro Abs 6.3 1.7 - 7.7 K/uL   Lymphocytes Relative 25 12 - 46 %   Lymphs Abs 2.4 0.7 - 4.0 K/uL   Monocytes Relative 5 3 - 12 %   Monocytes Absolute 0.5 0.1 - 1.0 K/uL   Eosinophils Relative 2 0 - 5 %   Eosinophils Absolute 0.2 0.0 - 0.7 K/uL   Basophils Relative 0 0 - 1 %   Basophils Absolute 0.0 0.0 - 0.1 K/uL  Comprehensive metabolic panel     Status: Abnormal   Collection Time: 09/03/14 10:37 AM  Result Value Ref Range   Sodium 134 (L) 135 - 145 mmol/L   Potassium 3.7 3.5 - 5.1 mmol/L    Chloride 100 (L) 101 - 111 mmol/L   CO2 26 22 - 32 mmol/L   Glucose, Bld 175 (H) 65 - 99 mg/dL   BUN 12 6 - 20 mg/dL   Creatinine, Ser 4.09 0.44 - 1.00 mg/dL   Calcium 8.8 (L) 8.9 - 10.3 mg/dL   Total Protein 7.0 6.5 - 8.1 g/dL   Albumin 3.3 (L) 3.5 - 5.0 g/dL   AST 29 15 - 41 U/L   ALT 42 14 - 54 U/L   Alkaline Phosphatase 105 38 - 126 U/L   Total Bilirubin 0.8 0.3 - 1.2 mg/dL   GFR calc non Af Amer >60 >60 mL/min   GFR calc Af Amer >60 >60 mL/min   Anion gap 8 5 - 15  Glucose, capillary     Status: Abnormal   Collection Time: 09/03/14  1:53 PM  Result Value Ref Range   Glucose-Capillary 174 (H) 65 - 99 mg/dL  Culture, routine-abscess     Status: None (Preliminary result)   Collection Time: 09/03/14  2:22 PM  Result Value Ref Range   Specimen Description ABSCESS RIGHT BREAST    Special Requests PT ON VANCOMYCIN    Gram Stain PENDING    Culture      NO GROWTH 1 DAY Performed at [facility]    Report Status PENDING   Glucose, capillary     Status: Abnormal   Collection Time: 09/03/14  3:22 PM  Result Value Ref Range   Glucose-Capillary 168 (H) 65 - 99 mg/dL   Comment 1 Notify RN    Comment 2 Document in Chart   Basic metabolic panel     Status: Abnormal   Collection Time: 09/04/14  4:06 AM  Result Value Ref Range   Sodium 136 135 - 145 mmol/L   Potassium 4.2 3.5 - 5.1 mmol/L   Chloride 100 (L) 101 - 111 mmol/L   CO2 25 22 - 32 mmol/L   Glucose, Bld 165 (H) 65 - 99 mg/dL   BUN 10 6 - 20 mg/dL   Creatinine, Ser 8.11 0.44 - 1.00 mg/dL   Calcium 8.4 (L) 8.9 - 10.3 mg/dL   GFR calc non Af Amer >60 >60 mL/min   GFR calc Af Amer >60 >60 mL/min   Anion gap 11 5 - 15  CBC     Status: None   Collection Time: 09/04/14  4:06 AM  Result Value Ref Range   WBC 8.4 4.0 - 10.5 K/uL   RBC 4.55 3.87 - 5.11 MIL/uL   Hemoglobin 12.7 12.0 - 15.0 g/dL   HCT 91.4 78.2 - 95.6 %   MCV 85.9  78.0 - 100.0 fL   MCH 27.9 26.0 - 34.0 pg   MCHC 32.5 30.0 - 36.0 g/dL   RDW 11.912.4  14.711.5 - 82.915.5 %   Platelets 158 150 - 400 K/uL     Studies/Results: No results found.  . enoxaparin (LOVENOX) injection  40 mg Subcutaneous Daily  . loratadine  10 mg Oral Daily  . piperacillin-tazobactam (ZOSYN)  IV  3.375 g Intravenous 3 times per day  . polyethylene glycol  17 g Oral TID  . vancomycin  1,000 mg Intravenous Q24H     Assessment/Plan: s/p Procedure(s): IRRIGATION AND DEBRIDEMENT RIGHT BREAST ABSCESS  POD #1. Incision and drainage right breast abscess. Wound stable. Wound care orders written for daily packing Continue vancomycin and Zosyn.  Nausea, vomiting, abdominal discomfort. Acute on chronic constipation. Mira lax 3 times a day Enemas if this does not work.  Complains of urinary urgency and frequency. Check PVR Catheterized urinalysis and urine culture  Diabetes mellitus. Will order SSI.    @PROBHOSP @  LOS: 1 day    Inaaya Vellucci M 09/04/2014  . .prob

## 2014-09-05 LAB — GLUCOSE, CAPILLARY
Glucose-Capillary: 184 mg/dL — ABNORMAL HIGH (ref 65–99)
Glucose-Capillary: 198 mg/dL — ABNORMAL HIGH (ref 65–99)

## 2014-09-05 MED ORDER — OXYCODONE HCL 5 MG PO TABS: 5.0000 mg | ORAL_TABLET | ORAL | Status: DC | PRN

## 2014-09-05 MED ORDER — DOXYCYCLINE HYCLATE 100 MG PO TABS: 100.0000 mg | ORAL_TABLET | Freq: Two times a day (BID) | ORAL | Status: DC

## 2014-09-05 MED ORDER — DOXYCYCLINE HYCLATE 100 MG PO TABS
100.0000 mg | ORAL_TABLET | Freq: Two times a day (BID) | ORAL | Status: DC
  Administered 2014-09-05: 100 mg via ORAL
  Filled 2014-09-05: qty 1

## 2014-09-05 MED ORDER — POLYETHYLENE GLYCOL 3350 17 G PO PACK
17.0000 g | PACK | Freq: Once | ORAL | Status: AC
  Administered 2014-09-05: 17 g via ORAL
  Filled 2014-09-05: qty 1

## 2014-09-05 NOTE — Discharge Summary (Signed)
Patient ID: Julia Shah [id number] y.o. [birth year]  Admit date: 09/03/2014  Discharge date and time: 09/05/2014  Admitting Physician: Ernestene Mention  Discharge Physician: Ernestene Mention  Admission Diagnoses: rt breast infection RIGHT BREAST ABCESS  Discharge Diagnoses: Right breast abscess                                         Diabetes mellitus type 2                                         Constipation  Operations: Procedure(s): IRRIGATION AND DEBRIDEMENT RIGHT BREAST ABSCESS  Admission Condition: good  Discharged Condition: good  Indication for Admission: 59 year old [name] treated for right breast infection by PCP for 2 days of clindamycin. Poor historian. Getting worse. Examination in emergency department revealed right breast cellulitis lower outer quadrant extending up to areola. Central fluctuance. Started on IV fluids, IV antibiotic and admitted for drainage in OR.  Hospital Course: On the day of admission the patient was taken to the operating room by Dr. Lindie Spruce and underwent incision and drainage and debridement of a right breast abscess. The wound was packed. She was covered with broad-spectrum antibiotic. Cultures grew gram-positive cocci. On postoperative day 1 the dressing was changed and the wound was clean although she still had some cellulitis. No odor. Nose. She was very anxious and felt uncomfortable going home. We continued the antibiotic. She had problems with constipation and nausea abdominal x-ray showed extensive stool in the colon. She was given a miralax and felt better but did not have a bowel movement.    On postop day 2 she was still anxious but doing better and felt ready to go home. The right breast is softer and less tender. Her blood sugars were somewhat elevated. She was told to restart her oral medications and see her PCP regarding her diabetes this week.    Home health nursing was arranged for daily wound care at home     She was  given a prescription for doxycycline for 7 days and OxyIR for pain as needed. She was told to take Mira lax anywhere from 1-4 times a day for her constipation she was asked to return to see Dr. Lindie Spruce in 1 week.  Consults: None  Significant Diagnostic Studies: microbiology: wound culture: positive for Gram-positive cocci  Treatments: surgery: Drainage and debridement right breast abscess  Disposition: Home  Patient Instructions:    Medication List    TAKE these medications        clindamycin 300 MG capsule  Commonly known as:  CLEOCIN  Take 300 mg by mouth 4 (four) times daily. Started on 09-01-14 for 7 days     doxycycline 100 MG tablet  Commonly known as:  VIBRA-TABS  Take 1 tablet (100 mg total) by mouth every 12 (twelve) hours.     Empagliflozin-Metformin HCl 08-998 MG Tabs  Take 0.5 tablets by mouth daily as needed. Unsure of indication     oxyCODONE 5 MG immediate release tablet  Commonly known as:  Oxy IR/ROXICODONE  Take 1-2 tablets (5-10 mg total) by mouth every 4 (four) hours as needed for moderate pain.        Activity: activity as tolerated Diet: diabetic diet Wound Care: as directed  Follow-up:  With Dr.  Jimmye NormanJames Shah in 1 week.  Signed: Angelia MouldHaywood M. Derrell Shah, M.D., FACS General and minimally invasive surgery Breast and Colorectal Surgery  09/05/2014, 8:55 AM

## 2014-09-05 NOTE — Care Management Note (Signed)
Case Management Note  Patient Details  Name: Julia Shah MRN: [id number] Date of Birth: 06/26/1955  Subjective/Objective:                  right breast abscess  Action/Plan: Discharge planning for dressing changes.  Expected Discharge Date:  09/05/2014               Expected Discharge Plan:  Home w Home Health Services  In-House Referral:  Clinical Social Work, Chaplain  Discharge planning Services  [facility]  Post Acute Care Choice:    Choice offered to:  Patient  DME Arranged:    DME Agency:     HH Arranged:  RN HH [facility]  [facility]  Status of Service:  Completed, signed off  Medicare Important Message Given:    Date Medicare IM Given:    Medicare IM give by:    Date Additional Medicare IM Given:    Additional Medicare Important Message give by:     If discussed at Long Length of Stay Meetings, dates discussed:    Additional Comments: Discussed with patient an daughter HH needs , [place] stay [place].  [facility] for dressing changes and safety evaluation.  Daughter concerned about patient being at home while she and sister were at work.  Per  patient ambulated with walker prior to coming into hospital, no changes to ambulation or functional ability noted during hospital course per nurse. Hence order for safety evaluation.  No DME needs at this time.  Daughter expressed concern about patient having to stay for an extended period of time at sister's home explained that [facility] [place], if patient decided to go home then someone would have to be taught by [facility]  to do the dressing changes.  Contact information for sister [address] , [place] [phone]... [phone]   Natallia Stellmach, Gerald LeitzLaWanda Jeannette, RN 09/05/2014, 5:22 PM

## 2014-09-05 NOTE — Discharge Instructions (Signed)
(  see above)  Be sure to call on Tuesday to make an appointment to see Dr. Lindie SpruceWyatt next week.  Be sure to call on Tuesday and make an appointment with your primary care physician for management of your diabetes

## 2014-09-05 NOTE — Progress Notes (Signed)
CSW received referral for assistance with HCPOA paperwork.   CSW met with the Pt and daughter at the bedside. Pt stated that she would like information on Release of information so that her family would be able to call in and receive medical information about current medical condition and course of treatment. CSW explained that Pt could both give a verbal consent of information and could complete a release of information at medical records. Pt and family were given contact information.   CSW also discussed information about HCPOA with Pt and daughter. CSW went over instructions and explained that the paperwork would need to be notarized and can not be signed until at the notary. Pt and daughter voiced understanding.   Pt and family also had concerns about HH needs and set up. CSW contacted CM with request.   No further CSW needs at this time.   CSW signing off.   [facility]  2S, 55M,3S, 5N, 6N [phone]

## 2014-09-06 LAB — CULTURE, ROUTINE-ABSCESS

## 2014-09-07 LAB — HEMOGLOBIN A1C
Hgb A1c MFr Bld: 13.3 % — ABNORMAL HIGH (ref 4.8–5.6)
MEAN PLASMA GLUCOSE: 335 mg/dL

## 2014-09-07 NOTE — Care Management (Signed)
Post discharge UR completed . Matayah Reyburn RN BSN  

## 2014-09-08 LAB — ANAEROBIC CULTURE

## 2014-09-09 LAB — CULTURE, BLOOD (ROUTINE X 2)
Culture: NO GROWTH
Culture: NO GROWTH

## 2016-05-30 ENCOUNTER — Encounter: Payer: Self-pay | Admitting: Internal Medicine

## 2016-05-30 ENCOUNTER — Ambulatory Visit (INDEPENDENT_AMBULATORY_CARE_PROVIDER_SITE_OTHER): Payer: Self-pay | Admitting: Internal Medicine

## 2016-05-30 VITALS — BP 112/76 | HR 87 | Ht 64.0 in | Wt 121.0 lb

## 2016-05-30 DIAGNOSIS — IMO0002 Reserved for concepts with insufficient information to code with codable children: Secondary | ICD-10-CM

## 2016-05-30 DIAGNOSIS — E1142 Type 2 diabetes mellitus with diabetic polyneuropathy: Secondary | ICD-10-CM

## 2016-05-30 DIAGNOSIS — E1165 Type 2 diabetes mellitus with hyperglycemia: Secondary | ICD-10-CM | POA: Diagnosis not present

## 2016-05-30 LAB — POCT GLYCOSYLATED HEMOGLOBIN (HGB A1C): HEMOGLOBIN A1C: 13.5

## 2016-05-30 NOTE — Patient Instructions (Signed)
Please start taking Lantus 25 units at bedtime EVERY DAY.  Please let me know if the sugars are consistently <80 or >200.  Please return in 1.5 months with your sugar log.   PATIENT INSTRUCTIONS FOR TYPE 2 DIABETES:  **Please join MyChart!** - see attached instructions about how to join if you have not done so already.  DIET AND EXERCISE Diet and exercise is an important part of diabetic treatment.  We recommended aerobic exercise in the form of brisk walking (working between 40-60% of maximal aerobic capacity, similar to brisk walking) for 150 minutes per week (such as 30 minutes five days per week) along with 3 times per week performing 'resistance' training (using various gauge rubber tubes with handles) 5-10 exercises involving the major muscle groups (upper body, lower body and core) performing 10-15 repetitions (or near fatigue) each exercise. Start at half the above goal but build slowly to reach the above goals. If limited by weight, joint pain, or disability, we recommend daily walking in a swimming pool with water up to waist to reduce pressure from joints while allow for adequate exercise.    BLOOD GLUCOSES Monitoring your blood glucoses is important for continued management of your diabetes. Please check your blood glucoses 2-4 times a day: fasting, before meals and at bedtime (you can rotate these measurements - e.g. one day check before the 3 meals, the next day check before 2 of the meals and before bedtime, etc.).   HYPOGLYCEMIA (low blood sugar) Hypoglycemia is usually a reaction to not eating, exercising, or taking too much insulin/ other diabetes drugs.  Symptoms include tremors, sweating, hunger, confusion, headache, etc. Treat IMMEDIATELY with 15 grams of Carbs: . 4 glucose tablets .  cup regular juice/soda . 2 tablespoons raisins . 4 teaspoons sugar . 1 tablespoon honey Recheck blood glucose in 15 mins and repeat above if still symptomatic/blood glucose  <100.  RECOMMENDATIONS TO REDUCE YOUR RISK OF DIABETIC COMPLICATIONS: * Take your prescribed MEDICATION(S) * Follow a DIABETIC diet: Complex carbs, fiber rich foods, (monounsaturated and polyunsaturated) fats * AVOID saturated/trans fats, high fat foods, >2,300 mg salt per day. * EXERCISE at least 5 times a week for 30 minutes or preferably daily.  * DO NOT SMOKE OR DRINK more than 1 drink a day. * Check your FEET every day. Do not wear tightfitting shoes. Contact us if you develop an ulcer * See your EYE doctor once a year or more if needed * Get a FLU shot once a year * Get a PNEUMONIA vaccine once before and once after age 75 years  GOALS:  * Your Hemoglobin A1c of <7%  * fasting sugars need to be <130 * after meals sugars need to be <180 (2h after you start eating) * Your Systolic BP should be 140 or lower  * Your Diastolic BP should be 80 or lower  * Your HDL (Good Cholesterol) should be 40 or higher  * Your LDL (Bad Cholesterol) should be 100 or lower. * Your Triglycerides should be 150 or lower  * Your Urine microalbumin (kidney function) should be <30 * Your Body Mass Index should be 25 or lower    Please consider the following ways to cut down carbs and fat and increase fiber and micronutrients in your diet: - substitute whole grain for white bread or pasta - substitute brown rice for white rice - substitute 90-calorie flat bread pieces for slices of bread when possible - substitute sweet potatoes or yams for white  potatoes - substitute humus for margarine - substitute tofu for cheese when possible - substitute almond or rice milk for regular milk (would not drink soy milk daily due to concern for soy estrogen influence on breast cancer risk) - substitute dark chocolate for other sweets when possible - substitute water - can add lemon or orange slices for taste - for diet sodas (artificial sweeteners will trick your body that you can eat sweets without getting calories and  will lead you to overeating and weight gain in the long run) - do not skip breakfast or other meals (this will slow down the metabolism and will result in more weight gain over time)  - can try smoothies made from fruit and almond/rice milk in am instead of regular breakfast - can also try old-fashioned (not instant) oatmeal made with almond/rice milk in am - order the dressing on the side when eating salad at a restaurant (pour less than half of the dressing on the salad) - eat as little meat as possible - can try juicing, but should not forget that juicing will get rid of the fiber, so would alternate with eating raw veg./fruits or drinking smoothies - use as little oil as possible, even when using olive oil - can dress a salad with a mix of balsamic vinegar and lemon juice, for e.g. - use agave nectar, stevia sugar, or regular sugar rather than artificial sweateners - steam or broil/roast veggies  - snack on veggies/fruit/nuts (unsalted, preferably) when possible, rather than processed foods - reduce or eliminate aspartame in diet (it is in diet sodas, chewing gum, etc) Read the labels!  Try to read Dr. Katherina RightNeal Barnard's book: "Program for Reversing Diabetes" for other ideas for healthy eating.

## 2016-05-30 NOTE — Progress Notes (Signed)
Patient ID: Freddye Cardamone, female   DOB: May 21, 1955, 61 y.o.   MRN: [id number]   HPI: Girlie Veltri is a 61 y.o.-year-old female, referred by her PCP, Dr. Simone Curia, for management of DM2, dx in 2007, insulin-dependent, uncontrolled, with complications (DR, PN). She is here with her daughter who offers part of the history.  Per review of notes from PCP, patient has been difficult to comply with diabetes treatment and sugar checks. She refused several medications in the past including insulin. She does complain about weight loss, blurry vision, increased thirst, increased urination, muscle cramps, constipation.  Last hemoglobin A1c was: 01/11/2016: HbA1c >15.5% Lab Results  Component Value Date   HGBA1C 13.3 (H) 09/04/2014   Pt is on: - Lantus 25 units but seldom, given by family members as she is very anxious afraid of needles - Glyxambi 1/2 tab  but seldom She tried Metformin and a Metformin combination therapy >> stomach pain/cramps  Pt checks her sugars 1x every 2 weeks - 270-300s - am: n/c - 2h after b'fast: n/c - before lunch: n/c - 2h after lunch: n/c - before dinner: n/c - 2h after dinner: n/c - bedtime: n/c - nighttime: n/c No lows.   Highest sugar was 600s.  Per daughter, she corrects a perceived low CBG with sodas, w/o checking sugars (!)  Glucometer: Banker  Pt's meals are: - Breakfast: Oatmeal and rice, 2 eggs or 2 teapoons peanuts - Lunch: Rice with [place] food or [place] hot dog - Dinner: Salmon, veggies, rice  - Snacks: Half a banana, milk 4 ounces  - no CKD, last BUN/creatinine:  01/11/2016: Glucose 332, BUN/creatinine 14/0.59, GFR 101  Lab Results  Component Value Date   BUN 10 09/04/2014   BUN 12 09/03/2014   CREATININE 0.54 09/04/2014   CREATININE 0.53 09/03/2014   - last set of lipids: 01/11/2016: 240/100/55/165  No results found for: CHOL, HDL, LDLCALC, LDLDIRECT, TRIG, CHOLHDL  On atorvastatin - taking this seldom. - last eye exam was in  04/16/2016. + DR. She has them q 6 mo. + cataracts, + glaucoma. - + numbness and tingling in her feet.  Pt has FH of DM in brother, father. .  ROS: Constitutional: no weight gain, + fatigue, + hot flushes, + poor sleep, + nocturia, excessive urination Eyes: + blurry vision, no xerophthalmia ENT: + sore throat, no nodules palpated in neck, no dysphagia/odynophagia, no hoarseness, + hypoacusis Cardiovascular: + CP/+ SOB/no palpitations/leg swelling Respiratory: no cough/+ SOB Gastrointestinal: + N/no V/D/+ C, + heartburn Musculoskeletal: + muscle aches/+ joint aches Skin: no rashes, + hair loss Neurological: no tremors/numbness/tingling/dizziness, + HA Psychiatric: + both: depression/anxiety  Past Medical History:  Diagnosis Date  . Abnormal liver function test   . Barrett esophagus   . Chronic kidney disease   . Complication of anesthesia   . Diabetes mellitus without complication (HCC)   . Gastric ulcer   . GERD (gastroesophageal reflux disease)   . Hyperlipidemia   . MDD (major depressive disorder)   . Pollen allergies   . PONV (postoperative nausea and vomiting)   . Renal disorder   . Vitamin D deficiency disease    Past Surgical History:  Procedure Laterality Date  . foot surgery Left   . IRRIGATION AND DEBRIDEMENT ABSCESS Left 09/03/2014   left breast  . IRRIGATION AND DEBRIDEMENT ABSCESS Right 09/03/2014   Procedure: IRRIGATION AND DEBRIDEMENT RIGHT BREAST ABSCESS;  Surgeon: Jimmye Norman, MD;  Location: [facility] OR;  Service: General;  Laterality: Right;  .  SHOULDER SURGERY     Social History   Social History  . Marital status: Separated     Spouse name: N/A  . Number of children: 3   Occupational History  . n/a   Social History Main Topics  . Smoking status: Never Smoker  . Smokeless tobacco: Never Used  . Alcohol use No  . Drug use: No   Current Outpatient Prescriptions  Medication Sig Dispense Refill  . acyclovir (ZOVIRAX) 800 MG tablet Take 800 mg by mouth  5 (five) times daily.    Marland Kitchen. atorvastatin (LIPITOR) 40 MG tablet Take 40 mg by mouth daily.    . cetirizine (ZYRTEC) 10 MG tablet Take 10 mg by mouth daily.    . Cholecalciferol (VITAMIN D3) 3000 units TABS Take by mouth.    . Empagliflozin-Linagliptin (GLYXAMBI PO) Take by mouth.    . Famotidine (PEPCID AC PO) Take by mouth.    . latanoprost (XALATAN) 0.005 % ophthalmic solution 1 drop at bedtime.    Bertram Gala. Polyethyl Glycol-Propyl Glycol (SYSTANE FREE OP) Apply to eye.    . ranitidine (ZANTAC) 150 MG tablet Take 150 mg by mouth 2 (two) times daily.    . Simethicone (GAS-X PO) Take by mouth.    Also, Lantus 25 units at bedtime.  None of the above medicines are taken on the regular basis.  Allergies  Allergen Reactions  . Benadryl [Diphenhydramine Hcl (Sleep)]     Trouble voiding   Family history: - Diabetes in father and brother, - Hypertension and hyperlipidemia in brother.  PE: BP 112/76   Pulse 87   Ht 5\' 4"  (1.626 m)   Wt 121 lb (54.9 kg)   SpO2 97%   BMI 20.77 kg/m   Wt Readings from Last 3 Encounters:  05/30/16 121 lb (54.9 kg)  09/03/14 120 lb (54.4 kg)   Constitutional:Normal weight, in NAD, but appears anxious  Eyes: PERRLA, EOMI, no exophthalmos ENT: moist mucous membranes, no thyromegaly, no cervical lymphadenopathy Cardiovascular: RRR, No MRG Respiratory: CTA B Gastrointestinal: abdomen soft, NT, ND, BS+ Musculoskeletal: no deformities, strength intact in all 4 Skin: moist, warm, no rashes Neurological: no tremor with outstretched hands, DTR normal in all 4  ASSESSMENT: 1. DM2, insulin-dependent, uncontrolled, with complications - DR - PN  PLAN:  1. Patient with long-standing, very uncontrolled diabetes, only diet controlled, occasionally taking Glyxambi her Lantus, but more on a when necessary basis. She does not understand the risk of such uncontrolled diabetes on every organ system in her body. We did address this today, and I tried my best to make her  understand about the risks of heart attack, stroke, kidney failure, vision loss, amputations. I recommended to start insulin and take it consistently. She is anxious, and I am not sure whether she was agreed to do this. At today's visit, she kept bringing other issues up to avoid discussing about her very uncontrolled diabetes...  - I advised her to not take Glyxambi anymore since this can make her even more dehydrated than she already is. However, she absolutely needs to take her insulin every day. I also strongly encouraged her to start checking sugars at different times of the day - check 2 times a day, rotating checks. Since she has anxiety when checking her sugars due to the lancet perforating the skin, we did discuss about a FreeStyle libre CGM. They will let me know if they want to go with this. - At next visit, we may need to check her insulin  production, but her sugars are so high right now that we cannot check a C-peptide - HbA1c today: 13.5%, very high. - I suggested to:  Patient Instructions  Please start taking Lantus 25 units at bedtime EVERY DAY.  Please let me know if the sugars are consistently <80 or >200.  Please return in 1.5 months with your sugar log.   - given sugar log and advised how to fill it and to bring it at next appt  - given foot care handout and explained the principles  - given instructions for hypoglycemia management "15-15 rule". I STRONGLY advised her to not try to correct hypoglycemia without checking her sugars to see if she is indeed low! - advised for yearly eye exams  - Return to clinic in 1.5 mo with sugar log   Carlus Pavlov, MD PhD [facility]

## 2016-07-23 ENCOUNTER — Ambulatory Visit: Payer: BLUE CROSS/BLUE SHIELD | Admitting: Internal Medicine

## 2020-03-25 ENCOUNTER — Emergency Department (HOSPITAL_BASED_OUTPATIENT_CLINIC_OR_DEPARTMENT_OTHER)
Admission: EM | Admit: 2020-03-25 | Discharge: 2020-03-25 | Disposition: A | Discharge status: Home / Self Care | Payer: BLUE CROSS/BLUE SHIELD | Attending: Emergency Medicine | Admitting: Emergency Medicine

## 2020-03-25 ENCOUNTER — Other Ambulatory Visit: Payer: Self-pay

## 2020-03-25 ENCOUNTER — Encounter (HOSPITAL_BASED_OUTPATIENT_CLINIC_OR_DEPARTMENT_OTHER): Payer: Self-pay | Admitting: Emergency Medicine

## 2020-03-25 ENCOUNTER — Emergency Department (HOSPITAL_COMMUNITY)
Admission: EM | Admit: 2020-03-25 | Discharge: 2020-03-25 | Discharge status: Against Medical Advice | Payer: BLUE CROSS/BLUE SHIELD

## 2020-03-25 DIAGNOSIS — E1169 Type 2 diabetes mellitus with other specified complication: Secondary | ICD-10-CM | POA: Insufficient documentation

## 2020-03-25 DIAGNOSIS — R197 Diarrhea, unspecified: Secondary | ICD-10-CM | POA: Diagnosis not present

## 2020-03-25 DIAGNOSIS — Z79899 Other long term (current) drug therapy: Secondary | ICD-10-CM | POA: Insufficient documentation

## 2020-03-25 DIAGNOSIS — E1122 Type 2 diabetes mellitus with diabetic chronic kidney disease: Secondary | ICD-10-CM | POA: Diagnosis not present

## 2020-03-25 DIAGNOSIS — Z7984 Long term (current) use of oral hypoglycemic drugs: Secondary | ICD-10-CM | POA: Diagnosis not present

## 2020-03-25 DIAGNOSIS — E785 Hyperlipidemia, unspecified: Secondary | ICD-10-CM | POA: Diagnosis not present

## 2020-03-25 DIAGNOSIS — N189 Chronic kidney disease, unspecified: Secondary | ICD-10-CM | POA: Diagnosis not present

## 2020-03-25 DIAGNOSIS — Z794 Long term (current) use of insulin: Secondary | ICD-10-CM | POA: Insufficient documentation

## 2020-03-25 DIAGNOSIS — N390 Urinary tract infection, site not specified: Secondary | ICD-10-CM | POA: Diagnosis not present

## 2020-03-25 LAB — CBC WITH DIFFERENTIAL/PLATELET
Abs Immature Granulocytes: 0.02 10*3/uL (ref 0.00–0.07)
Basophils Absolute: 0 10*3/uL (ref 0.0–0.1)
Basophils Relative: 0 %
Eosinophils Absolute: 0.1 10*3/uL (ref 0.0–0.5)
Eosinophils Relative: 2 %
HCT: 36.2 % (ref 36.0–46.0)
Hemoglobin: 12.2 g/dL (ref 12.0–15.0)
Immature Granulocytes: 0 %
Lymphocytes Relative: 38 %
Lymphs Abs: 1.7 10*3/uL (ref 0.7–4.0)
MCH: 28.9 pg (ref 26.0–34.0)
MCHC: 33.7 g/dL (ref 30.0–36.0)
MCV: 85.8 fL (ref 80.0–100.0)
Monocytes Absolute: 0.4 10*3/uL (ref 0.1–1.0)
Monocytes Relative: 8 %
Neutro Abs: 2.3 10*3/uL (ref 1.7–7.7)
Neutrophils Relative %: 52 %
Platelets: 148 10*3/uL — ABNORMAL LOW (ref 150–400)
RBC: 4.22 MIL/uL (ref 3.87–5.11)
RDW: 12.3 % (ref 11.5–15.5)
WBC: 4.5 10*3/uL (ref 4.0–10.5)
nRBC: 0 % (ref 0.0–0.2)

## 2020-03-25 LAB — COMPREHENSIVE METABOLIC PANEL
ALT: 91 U/L — ABNORMAL HIGH (ref 0–44)
AST: 60 U/L — ABNORMAL HIGH (ref 15–41)
Albumin: 3.8 g/dL (ref 3.5–5.0)
Alkaline Phosphatase: 77 U/L (ref 38–126)
Anion gap: 9 (ref 5–15)
BUN: 27 mg/dL — ABNORMAL HIGH (ref 8–23)
CO2: 24 mmol/L (ref 22–32)
Calcium: 9.2 mg/dL (ref 8.9–10.3)
Chloride: 104 mmol/L (ref 98–111)
Creatinine, Ser: 0.66 mg/dL (ref 0.44–1.00)
GFR, Estimated: >60 mL/min (ref >60)
Glucose, Bld: 106 mg/dL — ABNORMAL HIGH (ref 70–99)
Potassium: 3.5 mmol/L (ref 3.5–5.1)
Sodium: 137 mmol/L (ref 135–145)
Total Bilirubin: 0.7 mg/dL (ref 0.3–1.2)
Total Protein: 7.2 g/dL (ref 6.5–8.1)

## 2020-03-25 LAB — URINALYSIS, MICROSCOPIC (REFLEX)

## 2020-03-25 LAB — URINALYSIS, ROUTINE W REFLEX MICROSCOPIC
Bilirubin Urine: NEGATIVE
Glucose, UA: NEGATIVE mg/dL
Ketones, ur: NEGATIVE mg/dL
Nitrite: NEGATIVE
Protein, ur: NEGATIVE mg/dL
Specific Gravity, Urine: <1.005 — ABNORMAL LOW (ref 1.005–1.030)
pH: 6 (ref 5.0–8.0)

## 2020-03-25 MED ORDER — SODIUM CHLORIDE 0.9 % IV BOLUS
1000.0000 mL | Freq: Once | INTRAVENOUS | Status: AC
  Administered 2020-03-25: 1000 mL via INTRAVENOUS

## 2020-03-25 MED ORDER — ONDANSETRON HCL 4 MG/2ML IJ SOLN: 4.0000 mg | Freq: Once | INTRAMUSCULAR | Status: DC

## 2020-03-25 MED ORDER — DIPHENOXYLATE-ATROPINE 2.5-0.025 MG PO TABS: 1.0000 | ORAL_TABLET | Freq: Four times a day (QID) | ORAL | 0 refills | Status: AC | PRN

## 2020-03-25 MED ORDER — FOSFOMYCIN TROMETHAMINE 3 G PO PACK
3.0000 g | PACK | Freq: Once | ORAL | Status: AC
  Administered 2020-03-25: 3 g via ORAL
  Filled 2020-03-25: qty 3

## 2020-03-25 NOTE — ED Triage Notes (Signed)
Patient presents with complaints of diarrhea onset 5 days ago; complains of generalized weakness. States approx 30 episodes of diarrhea.

## 2020-03-25 NOTE — ED Provider Notes (Signed)
[facility] Provider Note: Julia Dell, MD, FACEP  CSN: [id number] MRN: [id number] ARRIVAL: 03/25/20 at 0424 ROOM: [facility]03/[facility]   CHIEF COMPLAINT  Diarrhea   HISTORY OF PRESENT ILLNESS  03/25/20 4:50 AM Julia Shah is a 64 y.o. female who is here with 5 days of diarrhea.  She had previously been constipated but had increased the proportion of vegetables in her diet.  She formally took Linzess but has not had any since October.  She has not been on an antibiotic recently.  She estimates she has had thirty episodes of diarrhea during that time.  She describes the stools as soft "like mashed potatoes" but not liquid.  They do not contain blood.  She is now feeling very weak.  She is also having chills.  She has not had any abdominal pain or vomiting but has had what she describes as the sensation of gas in her abdomen.  She has had increased flatulence and is sometimes soiled herself trying to get to the bathroom in time.  She has had polyuria which she attributes to her diabetes.  She also has numbness and pain in her fingers and lower legs which has been present for years.   Past Medical History:  Diagnosis Date   Abnormal liver function test    Barrett esophagus    Chronic kidney disease    Complication of anesthesia    Diabetes mellitus without complication (HCC)    Gastric ulcer    GERD (gastroesophageal reflux disease)    Hyperlipidemia    MDD (major depressive disorder)    Pollen allergies    PONV (postoperative nausea and vomiting)    Renal disorder    Vitamin D deficiency disease     Past Surgical History:  Procedure Laterality Date   foot surgery Left    IRRIGATION AND DEBRIDEMENT ABSCESS Left 09/03/2014   left breast   IRRIGATION AND DEBRIDEMENT ABSCESS Right 09/03/2014   Procedure: IRRIGATION AND DEBRIDEMENT RIGHT BREAST ABSCESS;  Surgeon: Jimmye Norman, MD;  Location: [facility] OR;  Service: General;  Laterality: Right;   SHOULDER SURGERY       History reviewed. No pertinent family history.  Social History   Tobacco Use   Smoking status: Never Smoker   Smokeless tobacco: Never Used  Substance Use Topics   Alcohol use: No   Drug use: No    Prior to Admission medications   Medication Sig Start Date End Date Taking? Authorizing Provider  acyclovir (ZOVIRAX) 800 MG tablet Take 800 mg by mouth 5 (five) times daily.    [provider]  atorvastatin (LIPITOR) 40 MG tablet Take 40 mg by mouth daily.    [provider]  cetirizine (ZYRTEC) 10 MG tablet Take 10 mg by mouth daily.    [provider]  Cholecalciferol (VITAMIN D3) 3000 units TABS Take by mouth.    [provider]  diphenoxylate-atropine (LOMOTIL) 2.5-0.025 MG tablet Take 1 tablet by mouth 4 (four) times daily as needed for diarrhea or loose stools (may cause constipation; use smallest effective dose). 03/25/20   Kendal Ghazarian, MD  Empagliflozin-Linagliptin (GLYXAMBI PO) Take by mouth.    [provider]  Famotidine (PEPCID AC PO) Take by mouth.    [provider]  Insulin Degludec (TRESIBA [place]) Inject into the skin.    [provider]  latanoprost (XALATAN) 0.005 % ophthalmic solution 1 drop at bedtime.    [provider]  Polyethyl Glycol-Propyl Glycol (SYSTANE FREE OP) Apply to eye.  [provider]  ranitidine (ZANTAC) 150 MG tablet Take 150 mg by mouth 2 (two) times daily.    [provider]  Simethicone (GAS-X PO) Take by mouth.    [provider]  sitaGLIPtin-metformin (JANUMET) 50-1000 MG tablet Take 1 tablet by mouth.    [provider]    Allergies Aspirin and Benadryl [diphenhydramine hcl (sleep)]   REVIEW OF SYSTEMS  Negative except as noted here or in the History of Present Illness.   PHYSICAL EXAMINATION  Initial Vital Signs Blood pressure (!) 149/81, pulse 89, temperature 98.1 F (36.7 C), temperature source Oral, resp. rate 14,  height 5\' 4"  (1.626 m), weight 54.9 kg, SpO2 100 %.  Examination General: Well-developed, well-nourished female in no acute distress; appearance consistent with age of record HENT: normocephalic; atraumatic Eyes: pupils equal, round and reactive to light; extraocular muscles intact Neck: supple Heart: regular rate and rhythm Lungs: clear to auscultation bilaterally Abdomen: soft; nondistended; mild suprapubic tenderness; bowel sounds present Extremities: No deformity; full range of motion; pulses normal Neurologic: Awake, alert and oriented; motor function intact in all extremities and symmetric; no facial droop; allodynia of lower legs and feet Skin: Warm and dry; patches of hypopigmentation Psychiatric: Normal mood and affect   RESULTS  Summary of this visit's results, reviewed and interpreted by myself:   EKG Interpretation  Date/Time:    Ventricular Rate:    PR Interval:    QRS Duration:   QT Interval:    QTC Calculation:   R Axis:     Text Interpretation:        Laboratory Studies: Results for orders placed or performed during the hospital encounter of 03/25/20 (from the past 24 hour(s))  Comprehensive metabolic panel     Status: Abnormal   Collection Time: 03/25/20  5:10 AM  Result Value Ref Range   Sodium 137 135 - 145 mmol/L   Potassium 3.5 3.5 - 5.1 mmol/L   Chloride 104 98 - 111 mmol/L   CO2 24 22 - 32 mmol/L   Glucose, Bld 106 (H) 70 - 99 mg/dL   BUN 27 (H) 8 - 23 mg/dL   Creatinine, Ser 03/27/20 0.44 - 1.00 mg/dL   Calcium 9.2 8.9 - 1.61 mg/dL   Total Protein 7.2 6.5 - 8.1 g/dL   Albumin 3.8 3.5 - 5.0 g/dL   AST 60 (H) 15 - 41 U/L   ALT 91 (H) 0 - 44 U/L   Alkaline Phosphatase 77 38 - 126 U/L   Total Bilirubin 0.7 0.3 - 1.2 mg/dL   GFR, Estimated 09.6 >04 mL/min   Anion gap 9 5 - 15  CBC with Differential/Platelet     Status: Abnormal   Collection Time: 03/25/20  5:10 AM  Result Value Ref Range   WBC 4.5 4.0 - 10.5 K/uL   RBC 4.22 3.87 - 5.11 MIL/uL    Hemoglobin 12.2 12.0 - 15.0 g/dL   HCT 03/27/20 09.8 - 11.9 %   MCV 85.8 80.0 - 100.0 fL   MCH 28.9 26.0 - 34.0 pg   MCHC 33.7 30.0 - 36.0 g/dL   RDW 14.7 82.9 - 56.2 %   Platelets 148 (L) 150 - 400 K/uL   nRBC 0.0 0.0 - 0.2 %   Neutrophils Relative % 52 %   Neutro Abs 2.3 1.7 - 7.7 K/uL   Lymphocytes Relative 38 %   Lymphs Abs 1.7 0.7 - 4.0 K/uL   Monocytes Relative 8 %   Monocytes Absolute 0.4 0.1 -  1.0 K/uL   Eosinophils Relative 2 %   Eosinophils Absolute 0.1 0.0 - 0.5 K/uL   Basophils Relative 0 %   Basophils Absolute 0.0 0.0 - 0.1 K/uL   Immature Granulocytes 0 %   Abs Immature Granulocytes 0.02 0.00 - 0.07 K/uL  Urinalysis, Routine w reflex microscopic Urine, Clean Catch     Status: Abnormal   Collection Time: 03/25/20  5:10 AM  Result Value Ref Range   Color, Urine YELLOW YELLOW   APPearance CLEAR CLEAR   Specific Gravity, Urine <1.005 (L) 1.005 - 1.030   pH 6.0 5.0 - 8.0   Glucose, UA NEGATIVE NEGATIVE mg/dL   Hgb urine dipstick TRACE (A) NEGATIVE   Bilirubin Urine NEGATIVE NEGATIVE   Ketones, ur NEGATIVE NEGATIVE mg/dL   Protein, ur NEGATIVE NEGATIVE mg/dL   Nitrite NEGATIVE NEGATIVE   Leukocytes,Ua LARGE (A) NEGATIVE  Urinalysis, Microscopic (reflex)     Status: Abnormal   Collection Time: 03/25/20  5:10 AM  Result Value Ref Range   RBC / HPF 6-10 0 - 5 RBC/hpf   WBC, UA 21-50 0 - 5 WBC/hpf   Bacteria, UA MANY (A) NONE SEEN   Squamous Epithelial / LPF 0-5 0 - 5   Non Squamous Epithelial PRESENT (A) NONE SEEN   Imaging Studies: No results found.  ED COURSE and MDM  Nursing notes, initial and subsequent vitals signs, including pulse oximetry, reviewed and interpreted by myself.  Vitals:   03/25/20 0443 03/25/20 0538 03/25/20 0545 03/25/20 0600  BP:  (!) 113/92 (!) 165/84 (!) 163/95  Pulse:  92 89 99  Resp:  18 17 18   Temp:      TempSrc:      SpO2:  100% 100% 100%  Weight: 54.9 kg     Height: 5\' 4"  (1.626 m)      Medications  ondansetron (ZOFRAN)  injection 4 mg (4 mg Intravenous Not Given 03/25/20 0518)  sodium chloride 0.9 % bolus 1,000 mL (0 mLs Intravenous Stopped 03/25/20 0547)  fosfomycin (MONUROL) packet 3 g (3 g Oral Given 03/25/20 0601)   6:38 AM Patient given normal saline 1 L bolus in ED.  Her labs showed no significant electrolyte abnormality.  Her transaminases were slightly elevated but she has a history of the same.  Her sugar was not significantly elevated.  Her polyuria is likely due to a urinary tract infection and we have treated this with 3 g of fosfomycin.  We will have her self collect at home stool specimen for PCR testing as she was unable to provide a specimen in the ED.  She was told that the cause of her diarrhea is unclear but a stool specimen would be beneficial in diagnosing any infectious etiology.  PROCEDURES  Procedures   ED DIAGNOSES     ICD-10-CM   1. Diarrhea of presumed infectious origin  R19.7   2. Lower urinary tract infection  N39.0        Ane Conerly, MD 03/25/20 [phone]

## 2020-03-25 NOTE — ED Notes (Signed)
Patient does not want to stay due to wait time.  Patient refusing to have bloodwork drawn at this time.

## 2020-03-26 LAB — GASTROINTESTINAL PANEL BY PCR, STOOL (REPLACES STOOL CULTURE)

## 2020-03-27 LAB — URINE CULTURE: Culture: 20000 — AB

## 2020-04-20 DIAGNOSIS — R197 Diarrhea, unspecified: Secondary | ICD-10-CM | POA: Diagnosis not present

## 2020-04-20 DIAGNOSIS — Z1152 Encounter for screening for COVID-19: Secondary | ICD-10-CM | POA: Diagnosis not present

## 2020-04-27 DIAGNOSIS — W19XXXA Unspecified fall, initial encounter: Secondary | ICD-10-CM | POA: Diagnosis not present

## 2020-04-27 DIAGNOSIS — S01111A Laceration without foreign body of right eyelid and periocular area, initial encounter: Secondary | ICD-10-CM | POA: Diagnosis not present

## 2020-04-27 DIAGNOSIS — R55 Syncope and collapse: Secondary | ICD-10-CM | POA: Diagnosis not present

## 2020-04-27 DIAGNOSIS — Z20822 Contact with and (suspected) exposure to covid-19: Secondary | ICD-10-CM | POA: Diagnosis not present

## 2020-04-27 DIAGNOSIS — E1165 Type 2 diabetes mellitus with hyperglycemia: Secondary | ICD-10-CM | POA: Diagnosis not present

## 2020-04-27 DIAGNOSIS — S0181XA Laceration without foreign body of other part of head, initial encounter: Secondary | ICD-10-CM | POA: Diagnosis not present

## 2020-04-27 DIAGNOSIS — R52 Pain, unspecified: Secondary | ICD-10-CM | POA: Diagnosis not present

## 2020-04-27 DIAGNOSIS — R58 Hemorrhage, not elsewhere classified: Secondary | ICD-10-CM | POA: Diagnosis not present

## 2020-04-27 DIAGNOSIS — Z23 Encounter for immunization: Secondary | ICD-10-CM | POA: Diagnosis not present

## 2020-04-27 DIAGNOSIS — R4020 Unspecified coma: Secondary | ICD-10-CM | POA: Diagnosis not present

## 2020-04-27 DIAGNOSIS — G4489 Other headache syndrome: Secondary | ICD-10-CM | POA: Diagnosis not present

## 2020-04-28 DIAGNOSIS — R55 Syncope and collapse: Secondary | ICD-10-CM | POA: Diagnosis not present

## 2020-05-12 ENCOUNTER — Emergency Department (HOSPITAL_BASED_OUTPATIENT_CLINIC_OR_DEPARTMENT_OTHER)
Admission: EM | Admit: 2020-05-12 | Discharge: 2020-05-12 | Disposition: A | Discharge status: Home / Self Care | Payer: 59 | Attending: Emergency Medicine | Admitting: Emergency Medicine

## 2020-05-12 ENCOUNTER — Emergency Department (HOSPITAL_BASED_OUTPATIENT_CLINIC_OR_DEPARTMENT_OTHER): Payer: 59

## 2020-05-12 ENCOUNTER — Encounter (HOSPITAL_BASED_OUTPATIENT_CLINIC_OR_DEPARTMENT_OTHER): Payer: Self-pay | Admitting: Emergency Medicine

## 2020-05-12 ENCOUNTER — Other Ambulatory Visit: Payer: Self-pay

## 2020-05-12 DIAGNOSIS — W19XXXA Unspecified fall, initial encounter: Secondary | ICD-10-CM

## 2020-05-12 DIAGNOSIS — N189 Chronic kidney disease, unspecified: Secondary | ICD-10-CM | POA: Insufficient documentation

## 2020-05-12 DIAGNOSIS — E1140 Type 2 diabetes mellitus with diabetic neuropathy, unspecified: Secondary | ICD-10-CM | POA: Insufficient documentation

## 2020-05-12 DIAGNOSIS — R0781 Pleurodynia: Secondary | ICD-10-CM | POA: Insufficient documentation

## 2020-05-12 DIAGNOSIS — W228XXA Striking against or struck by other objects, initial encounter: Secondary | ICD-10-CM | POA: Diagnosis not present

## 2020-05-12 DIAGNOSIS — S0003XA Contusion of scalp, initial encounter: Secondary | ICD-10-CM | POA: Diagnosis not present

## 2020-05-12 DIAGNOSIS — R42 Dizziness and giddiness: Secondary | ICD-10-CM

## 2020-05-12 DIAGNOSIS — R739 Hyperglycemia, unspecified: Secondary | ICD-10-CM

## 2020-05-12 DIAGNOSIS — Z794 Long term (current) use of insulin: Secondary | ICD-10-CM | POA: Insufficient documentation

## 2020-05-12 DIAGNOSIS — R197 Diarrhea, unspecified: Secondary | ICD-10-CM | POA: Diagnosis not present

## 2020-05-12 DIAGNOSIS — E1165 Type 2 diabetes mellitus with hyperglycemia: Secondary | ICD-10-CM | POA: Insufficient documentation

## 2020-05-12 DIAGNOSIS — S0990XA Unspecified injury of head, initial encounter: Secondary | ICD-10-CM | POA: Diagnosis not present

## 2020-05-12 LAB — COMPREHENSIVE METABOLIC PANEL
ALT: 68 U/L — ABNORMAL HIGH (ref 0–44)
AST: 37 U/L (ref 15–41)
Albumin: 3.8 g/dL (ref 3.5–5.0)
Alkaline Phosphatase: 114 U/L (ref 38–126)
Anion gap: 10 (ref 5–15)
BUN: 23 mg/dL (ref 8–23)
CO2: 27 mmol/L (ref 22–32)
Calcium: 9.3 mg/dL (ref 8.9–10.3)
Chloride: 92 mmol/L — ABNORMAL LOW (ref 98–111)
Creatinine, Ser: 0.75 mg/dL (ref 0.44–1.00)
GFR, Estimated: >60 mL/min (ref >60)
Glucose, Bld: 491 mg/dL — ABNORMAL HIGH (ref 70–99)
Potassium: 4.5 mmol/L (ref 3.5–5.1)
Sodium: 129 mmol/L — ABNORMAL LOW (ref 135–145)
Total Bilirubin: 0.6 mg/dL (ref 0.3–1.2)
Total Protein: 8.2 g/dL — ABNORMAL HIGH (ref 6.5–8.1)

## 2020-05-12 LAB — CBC WITH DIFFERENTIAL/PLATELET
Abs Immature Granulocytes: 0.04 10*3/uL (ref 0.00–0.07)
Basophils Absolute: 0 10*3/uL (ref 0.0–0.1)
Basophils Relative: 0 %
Eosinophils Absolute: 0.1 10*3/uL (ref 0.0–0.5)
Eosinophils Relative: 2 %
HCT: 38.7 % (ref 36.0–46.0)
Hemoglobin: 13.3 g/dL (ref 12.0–15.0)
Immature Granulocytes: 1 %
Lymphocytes Relative: 37 %
Lymphs Abs: 1.8 10*3/uL (ref 0.7–4.0)
MCH: 28.7 pg (ref 26.0–34.0)
MCHC: 34.4 g/dL (ref 30.0–36.0)
MCV: 83.4 fL (ref 80.0–100.0)
Monocytes Absolute: 0.4 10*3/uL (ref 0.1–1.0)
Monocytes Relative: 7 %
Neutro Abs: 2.6 10*3/uL (ref 1.7–7.7)
Neutrophils Relative %: 53 %
Platelets: 134 10*3/uL — ABNORMAL LOW (ref 150–400)
RBC: 4.64 MIL/uL (ref 3.87–5.11)
RDW: 11.9 % (ref 11.5–15.5)
WBC: 5 10*3/uL (ref 4.0–10.5)
nRBC: 0 % (ref 0.0–0.2)

## 2020-05-12 LAB — URINALYSIS, ROUTINE W REFLEX MICROSCOPIC
Bilirubin Urine: NEGATIVE
Glucose, UA: >=500 mg/dL — AB
Ketones, ur: NEGATIVE mg/dL
Nitrite: POSITIVE — AB
Protein, ur: NEGATIVE mg/dL
Specific Gravity, Urine: 1.005 (ref 1.005–1.030)
pH: 7.5 (ref 5.0–8.0)

## 2020-05-12 LAB — URINALYSIS, MICROSCOPIC (REFLEX)

## 2020-05-12 LAB — CBG MONITORING, ED: Glucose-Capillary: 514 mg/dL (ref 70–99)

## 2020-05-12 MED ORDER — LACTATED RINGERS IV BOLUS
1000.0000 mL | Freq: Once | INTRAVENOUS | Status: AC
  Administered 2020-05-12: 1000 mL via INTRAVENOUS

## 2020-05-12 NOTE — ED Triage Notes (Signed)
Pt fell this am.  Pt hit the back of her head.  Pt had previous fall in January to front of her head.  No LOC this event.  Pt also c/o upper back pain.  Pt also mentioned some heartburn

## 2020-05-12 NOTE — Discharge Instructions (Signed)
The CAT scan was normal today without any signs of bleeding in the brain or broken bones.  You can use ice to help with the bruising and swelling.  He can also take Tylenol as needed for the pain.  Your blood sugar was elevated today but the rest of your labs look normal.  Your blood sugar is most likely elevated because you have stopped taking your insulin and it would be important to restart the insulin.  Make sure you are drinking plenty of fluids to stay hydrated and make sure you are also walking with a device where you can sit down if you become dizzy.  The urine today did not show any signs of infection but a culture was sent.

## 2020-05-12 NOTE — ED Notes (Signed)
Patient transported to CT 

## 2020-05-12 NOTE — ED Notes (Signed)
Patient transported to X-ray 

## 2020-05-12 NOTE — ED Provider Notes (Signed)
[facility] EMERGENCY DEPARTMENT Provider Note   CSN: [id number] Arrival date & time: 05/12/20  0847     History Chief Complaint  Patient presents with  . Fall    Julia Shah is a 65 y.o. female.  Patient is a 65 year old female with a history of chronic kidney disease, diabetes, hyperlipidemia, gastric ulcer/GERD with frequent diarrhea who is presenting today after a fall and head injury.  Patient had a fall in January due to syncope after having multiple rounds of diarrhea resulting in a laceration over her right eyebrow that is just recently healed.  Granddaughter is with the patient reports today they were walking with her walker when she became lightheaded and she sat down on the walker.  The granddaughter was pushing the walker when she hit a bump and the patient fell backwards hitting her head on a tile floor.  She does not take anticoagulation and she had no loss of consciousness.  She is complaining of a headache and a hematoma where she hit her head.  She has no neck pain but is reporting some intermittent pain in her right ribs.  She is also burping throughout the exam and complaining of a lot of gas.  She did take Gaviscon and Tums this morning but reports she has a lot of gas a lot.  She is also been continuing to have intermittent diarrhea and feels that it is related to her diabetes medication.  She has had some improvement in her oral intake since she has been with her son but was thought to have orthostatic hypotension causing her last syncopal event.  She denies any chest pain or shortness of breath at this time.  She has no abdominal pain.  She does have plans to follow-up with her PCP but the appointment is on February 16.  The history is provided by the patient and a relative. The history is limited by a language barrier. A language interpreter was used.  Fall       Past Medical History:  Diagnosis Date  . Abnormal liver function test   . Barrett esophagus   .  Chronic kidney disease   . Complication of anesthesia   . Diabetes mellitus without complication (HCC)   . Gastric ulcer   . GERD (gastroesophageal reflux disease)   . Hyperlipidemia   . MDD (major depressive disorder)   . Pollen allergies   . PONV (postoperative nausea and vomiting)   . Renal disorder   . Vitamin D deficiency disease     Patient Active Problem List   Diagnosis Date Noted  . Abscess of right breast 09/03/2014  . Uncontrolled type 2 diabetes mellitus with peripheral neuropathy (HCC) 09/03/2014  . Breast abscess 09/03/2014    Past Surgical History:  Procedure Laterality Date  . foot surgery Left   . IRRIGATION AND DEBRIDEMENT ABSCESS Left 09/03/2014   left breast  . IRRIGATION AND DEBRIDEMENT ABSCESS Right 09/03/2014   Procedure: IRRIGATION AND DEBRIDEMENT RIGHT BREAST ABSCESS;  Surgeon: Jimmye Norman, MD;  Location: [facility] OR;  Service: General;  Laterality: Right;  . SHOULDER SURGERY       OB History   No obstetric history on file.     No family history on file.  Social History   Tobacco Use  . Smoking status: Never Smoker  . Smokeless tobacco: Never Used  Substance Use Topics  . Alcohol use: No  . Drug use: No    Home Medications Prior to Admission medications  Medication Sig Start Date End Date Taking? Authorizing Provider  atorvastatin (LIPITOR) 40 MG tablet Take 40 mg by mouth daily.    [provider]  cetirizine (ZYRTEC) 10 MG tablet Take 10 mg by mouth daily.    [provider]  Cholecalciferol (VITAMIN D3) 3000 units TABS Take by mouth.    [provider]  diphenoxylate-atropine (LOMOTIL) 2.5-0.025 MG tablet Take 1 tablet by mouth 4 (four) times daily as needed for diarrhea or loose stools (may cause constipation; use smallest effective dose). 03/25/20   Molpus, John, MD  Empagliflozin-Linagliptin (GLYXAMBI PO) Take by mouth.    [provider]  Famotidine (PEPCID AC PO) Take by mouth.    [provider]  Insulin Degludec (TRESIBA [place]) Inject into the skin.    [provider]  latanoprost (XALATAN) 0.005 % ophthalmic solution 1 drop at bedtime.    [provider]  Polyethyl Glycol-Propyl Glycol (SYSTANE FREE OP) Apply to eye.    [provider]  ranitidine (ZANTAC) 150 MG tablet Take 150 mg by mouth 2 (two) times daily.    [provider]  sitaGLIPtin-metformin (JANUMET) 50-1000 MG tablet Take 1 tablet by mouth.    [provider]  TRESIBA FLEXTOUCH 200 UNIT/ML FlexTouch Pen SMARTSIG:40 Unit(s) SUB-Q Daily 03/20/20   [provider]    Allergies    Aspirin and Benadryl [diphenhydramine hcl (sleep)]  Review of Systems   Review of Systems  All other systems reviewed and are negative.   Physical Exam Updated Vital Signs BP (!) 145/69   Pulse 96   Temp 98.3 F (36.8 C) (Oral)   Resp 18   SpO2 100%   Physical Exam Vitals and nursing note reviewed.  Constitutional:      General: She is not in acute distress.    Appearance: She is well-developed, normal weight and well-nourished.  HENT:     Head: Normocephalic.      Comments: Healing laceration over the right eyebrow and healing ecchymosis over the right side of the face including the cheek    Mouth/Throat:     Mouth: Mucous membranes are moist.  Eyes:     Extraocular Movements: EOM normal.     Pupils: Pupils are equal, round, and reactive to light.  Cardiovascular:     Rate and Rhythm: Regular rhythm. Tachycardia present.     Pulses: Normal pulses and intact distal pulses.     Heart sounds: Normal heart sounds. No murmur heard. No friction rub.  Pulmonary:     Effort: Pulmonary effort is normal.     Breath sounds: Normal breath sounds. No wheezing or rales.     Comments: Mild tenderness in the right ribs without crepitus noted Chest:     Chest wall: Tenderness present.  Abdominal:     General: Bowel sounds are normal. There is no distension.      Palpations: Abdomen is soft.     Tenderness: There is no abdominal tenderness. There is no guarding or rebound.  Musculoskeletal:        General: No tenderness. Normal range of motion.     Cervical back: Neck supple. No tenderness.     Comments: No edema. Healing ecchymosis over the right knee.  Full ROM and no knee pain.  Skin:    General: Skin is warm and dry.     Findings: No rash.  Neurological:     General: No focal deficit present.     Mental Status: She is alert  and oriented to person, place, and time. Mental status is at baseline.     Cranial Nerves: No cranial nerve deficit.  Psychiatric:        Mood and Affect: Mood and affect and mood normal.        Behavior: Behavior normal.        Thought Content: Thought content normal.     ED Results / Procedures / Treatments   Labs (all labs ordered are listed, but only abnormal results are displayed) Labs Reviewed  CBC WITH DIFFERENTIAL/PLATELET - Abnormal; Notable for the following components:      Result Value   Platelets 134 (*)    All other components within normal limits  COMPREHENSIVE METABOLIC PANEL - Abnormal; Notable for the following components:   Sodium 129 (*)    Chloride 92 (*)    Glucose, Bld 491 (*)    Total Protein 8.2 (*)    ALT 68 (*)    All other components within normal limits  URINALYSIS, ROUTINE W REFLEX MICROSCOPIC - Abnormal; Notable for the following components:   Glucose, UA >=500 (*)    Hgb urine dipstick MODERATE (*)    Nitrite POSITIVE (*)    Leukocytes,Ua TRACE (*)    All other components within normal limits  URINALYSIS, MICROSCOPIC (REFLEX) - Abnormal; Notable for the following components:   Bacteria, UA RARE (*)    All other components within normal limits  CBG MONITORING, ED - Abnormal; Notable for the following components:   Glucose-Capillary 514 (*)    All other components within normal limits    EKG None  Radiology DG Chest 2 View  Result Date: 05/12/2020 CLINICAL DATA:  Rib  pain. EXAM: CHEST - 2 VIEW COMPARISON:  No prior. FINDINGS: Mediastinum and hilar structures normal. Lungs are clear. No pleural effusion or pneumothorax. Heart size normal. No acute bony abnormality. IMPRESSION: No acute cardiopulmonary disease. Electronically Signed   By: Maisie Fus  Register   On: 05/12/2020 10:01   CT Head Wo Contrast  Result Date: 05/12/2020 CLINICAL DATA:  Head trauma, fall EXAM: CT HEAD WITHOUT CONTRAST TECHNIQUE: Contiguous axial images were obtained from the base of the skull through the vertex without intravenous contrast. COMPARISON:  04/27/2020 FINDINGS: Brain: Normal anatomic configuration. No abnormal intra or extra-axial mass lesion or fluid collection. No abnormal mass effect or midline shift. No evidence of acute intracranial hemorrhage or infarct. Ventricular size is normal. Cerebellum unremarkable. Vascular: Unremarkable Skull: Intact Sinuses/Orbits: Paranasal sinuses are clear. Orbits are unremarkable. Other: Mastoid air cells and middle ear cavities are clear. IMPRESSION: No acute intracranial abnormality. Electronically Signed   By: Helyn Numbers MD   On: 05/12/2020 10:08    Procedures Procedures   Medications Ordered in ED Medications  lactated ringers bolus 1,000 mL (has no administration in time range)    ED Course  I have reviewed the triage vital signs and the nursing notes.  Pertinent labs & imaging results that were available during my care of the patient were reviewed by me and considered in my medical decision making (see chart for details).    MDM Rules/Calculators/A&P                          65 year old female presenting today after a fall and head injury.  However patient has been having episodes of dizziness, near syncope and syncope for the last 1 to 2 months.  She has been having intermittent excessive diarrhea and does complain  of a lot of gas.  She was seen 2 weeks ago after a syncopal event and granddaughter reported at that time she had  labs done and was told everything was okay except that she was dehydrated.  Patient has been attempting to drink more but reports she often does not have an appetite.  On exam she does have a hematoma and will get a CT to ensure no acute underlying injury.  Neurologically she appears intact at this time.  She is having no significant abdominal pain but she is mildly tachycardic and today has a blood sugar of 513.  Will give a bolus of fluid.  Will recheck labs to ensure no other acute issue.  She is following up as an outpatient and may need further medication adjustment.  She is not currently on any blood pressure medication at this time and blood pressure here was 145/69.  She denies any recent Covid-like symptoms and low suspicion for an acute abdominal process based on benign exam.  1:08 PM Patient's head CT and chest x-ray without acute findings.  Patient's initial blood sugar was 513 and after IV fluids has improved to 400.  Patient CMP with minimal hyponatremia most likely related to the hyperglycemia and mild decreased chloride which again is probably from the recurrent diarrhea.  Normal anion gap today of 10 and low suspicion for DKA.  On further discussion patient has stopped taking her insulin for the last month because she thought that was causing the dizziness.  This is most likely why she has had the elevated blood sugar and discussed with the patient that she needs to resume her insulin therapy because elevated blood sugar could also make her dizzy.  UA without significant signs for infection today and a culture was sent patient was having occasional dysuria.  CMP otherwise with normal renal function today and normal potassium.  Patient findings were discussed with the patient and her granddaughter.  Questions were answered.  She has follow-up with gastroenterology on February 10 and encouraged her to keep that appointment because with the ongoing diarrhea she needs further  evaluation.  MDM Number of Diagnoses or Management Options   Amount and/or Complexity of Data Reviewed Clinical lab tests: ordered and reviewed Tests in the radiology section of CPT: ordered Decide to obtain previous medical records or to obtain history from someone other than the patient: yes Obtain history from someone other than the patient: yes Review and summarize past medical records: yes Discuss the patient with other providers: no Independent visualization of images, tracings, or specimens: yes  Risk of Complications, Morbidity, and/or Mortality Presenting problems: moderate Diagnostic procedures: low Management options: low  Patient Progress Patient progress: stable    Final Clinical Impression(s) / ED Diagnoses Final diagnoses:  Fall, initial encounter  Contusion of scalp, initial encounter  Hyperglycemia  Dizziness    Rx / DC Orders ED Discharge Orders    None       Gwyneth Sprout, MD 05/12/20 1313

## 2020-06-28 DIAGNOSIS — E1140 Type 2 diabetes mellitus with diabetic neuropathy, unspecified: Secondary | ICD-10-CM | POA: Diagnosis not present

## 2020-06-28 DIAGNOSIS — I10 Essential (primary) hypertension: Secondary | ICD-10-CM | POA: Diagnosis not present

## 2020-06-28 DIAGNOSIS — R42 Dizziness and giddiness: Secondary | ICD-10-CM | POA: Diagnosis not present

## 2020-06-28 DIAGNOSIS — Z7689 Persons encountering health services in other specified circumstances: Secondary | ICD-10-CM | POA: Diagnosis not present

## 2020-06-28 DIAGNOSIS — R634 Abnormal weight loss: Secondary | ICD-10-CM | POA: Diagnosis not present

## 2020-06-28 DIAGNOSIS — R112 Nausea with vomiting, unspecified: Secondary | ICD-10-CM | POA: Diagnosis not present

## 2020-06-28 DIAGNOSIS — R197 Diarrhea, unspecified: Secondary | ICD-10-CM | POA: Diagnosis not present

## 2020-07-19 DIAGNOSIS — R229 Localized swelling, mass and lump, unspecified: Secondary | ICD-10-CM | POA: Diagnosis not present

## 2020-07-19 DIAGNOSIS — R197 Diarrhea, unspecified: Secondary | ICD-10-CM | POA: Diagnosis not present

## 2020-07-19 DIAGNOSIS — K921 Melena: Secondary | ICD-10-CM | POA: Diagnosis not present

## 2020-07-21 DIAGNOSIS — R197 Diarrhea, unspecified: Secondary | ICD-10-CM | POA: Diagnosis not present

## 2020-07-21 DIAGNOSIS — E1140 Type 2 diabetes mellitus with diabetic neuropathy, unspecified: Secondary | ICD-10-CM | POA: Diagnosis not present

## 2020-07-26 DIAGNOSIS — E1140 Type 2 diabetes mellitus with diabetic neuropathy, unspecified: Secondary | ICD-10-CM | POA: Diagnosis not present

## 2020-07-26 DIAGNOSIS — R222 Localized swelling, mass and lump, trunk: Secondary | ICD-10-CM | POA: Diagnosis not present

## 2020-07-26 DIAGNOSIS — I10 Essential (primary) hypertension: Secondary | ICD-10-CM | POA: Diagnosis not present

## 2020-07-26 DIAGNOSIS — E782 Mixed hyperlipidemia: Secondary | ICD-10-CM | POA: Diagnosis not present

## 2020-07-26 DIAGNOSIS — K2100 Gastro-esophageal reflux disease with esophagitis, without bleeding: Secondary | ICD-10-CM | POA: Diagnosis not present

## 2020-07-26 DIAGNOSIS — Z9114 Patient's other noncompliance with medication regimen: Secondary | ICD-10-CM | POA: Diagnosis not present

## 2020-07-26 DIAGNOSIS — Z9119 Patient's noncompliance with other medical treatment and regimen: Secondary | ICD-10-CM | POA: Diagnosis not present

## 2020-09-09 DIAGNOSIS — K921 Melena: Secondary | ICD-10-CM | POA: Diagnosis not present

## 2020-09-09 DIAGNOSIS — K649 Unspecified hemorrhoids: Secondary | ICD-10-CM | POA: Diagnosis not present

## 2020-09-09 DIAGNOSIS — R197 Diarrhea, unspecified: Secondary | ICD-10-CM | POA: Diagnosis not present

## 2020-09-09 DIAGNOSIS — K648 Other hemorrhoids: Secondary | ICD-10-CM | POA: Diagnosis not present

## 2020-09-09 DIAGNOSIS — K529 Noninfective gastroenteritis and colitis, unspecified: Secondary | ICD-10-CM | POA: Diagnosis not present

## 2020-09-09 DIAGNOSIS — K6389 Other specified diseases of intestine: Secondary | ICD-10-CM | POA: Diagnosis not present

## 2020-09-13 DIAGNOSIS — K760 Fatty (change of) liver, not elsewhere classified: Secondary | ICD-10-CM | POA: Diagnosis not present

## 2020-09-13 DIAGNOSIS — R222 Localized swelling, mass and lump, trunk: Secondary | ICD-10-CM | POA: Diagnosis not present

## 2020-09-13 DIAGNOSIS — K7689 Other specified diseases of liver: Secondary | ICD-10-CM | POA: Diagnosis not present

## 2020-10-25 DIAGNOSIS — E1140 Type 2 diabetes mellitus with diabetic neuropathy, unspecified: Secondary | ICD-10-CM | POA: Diagnosis not present

## 2020-10-25 DIAGNOSIS — Z9119 Patient's noncompliance with other medical treatment and regimen: Secondary | ICD-10-CM | POA: Diagnosis not present

## 2020-10-25 DIAGNOSIS — E782 Mixed hyperlipidemia: Secondary | ICD-10-CM | POA: Diagnosis not present

## 2020-10-25 DIAGNOSIS — I10 Essential (primary) hypertension: Secondary | ICD-10-CM | POA: Diagnosis not present

## 2020-11-08 DIAGNOSIS — H524 Presbyopia: Secondary | ICD-10-CM | POA: Diagnosis not present

## 2020-11-08 DIAGNOSIS — H401134 Primary open-angle glaucoma, bilateral, indeterminate stage: Secondary | ICD-10-CM | POA: Diagnosis not present

## 2020-11-08 DIAGNOSIS — Z7984 Long term (current) use of oral hypoglycemic drugs: Secondary | ICD-10-CM | POA: Diagnosis not present

## 2020-11-08 DIAGNOSIS — H25043 Posterior subcapsular polar age-related cataract, bilateral: Secondary | ICD-10-CM | POA: Diagnosis not present

## 2020-11-08 DIAGNOSIS — Z794 Long term (current) use of insulin: Secondary | ICD-10-CM | POA: Diagnosis not present

## 2020-11-08 DIAGNOSIS — H2513 Age-related nuclear cataract, bilateral: Secondary | ICD-10-CM | POA: Diagnosis not present

## 2020-11-08 DIAGNOSIS — E119 Type 2 diabetes mellitus without complications: Secondary | ICD-10-CM | POA: Diagnosis not present

## 2020-11-09 DIAGNOSIS — E1142 Type 2 diabetes mellitus with diabetic polyneuropathy: Secondary | ICD-10-CM | POA: Diagnosis not present

## 2020-11-09 DIAGNOSIS — Z794 Long term (current) use of insulin: Secondary | ICD-10-CM | POA: Diagnosis not present

## 2020-11-09 DIAGNOSIS — E782 Mixed hyperlipidemia: Secondary | ICD-10-CM | POA: Diagnosis not present

## 2020-11-09 DIAGNOSIS — E1140 Type 2 diabetes mellitus with diabetic neuropathy, unspecified: Secondary | ICD-10-CM | POA: Diagnosis not present

## 2020-11-09 DIAGNOSIS — Z7984 Long term (current) use of oral hypoglycemic drugs: Secondary | ICD-10-CM | POA: Diagnosis not present

## 2021-02-01 DIAGNOSIS — Z13820 Encounter for screening for osteoporosis: Secondary | ICD-10-CM | POA: Diagnosis not present

## 2021-02-01 DIAGNOSIS — Z1231 Encounter for screening mammogram for malignant neoplasm of breast: Secondary | ICD-10-CM | POA: Diagnosis not present

## 2021-02-01 DIAGNOSIS — Z01419 Encounter for gynecological examination (general) (routine) without abnormal findings: Secondary | ICD-10-CM | POA: Diagnosis not present

## 2021-02-01 DIAGNOSIS — N760 Acute vaginitis: Secondary | ICD-10-CM | POA: Diagnosis not present

## 2021-02-01 DIAGNOSIS — Z6820 Body mass index (BMI) 20.0-20.9, adult: Secondary | ICD-10-CM | POA: Diagnosis not present

## 2021-02-15 DIAGNOSIS — L739 Follicular disorder, unspecified: Secondary | ICD-10-CM | POA: Diagnosis not present

## 2021-03-15 DIAGNOSIS — E1140 Type 2 diabetes mellitus with diabetic neuropathy, unspecified: Secondary | ICD-10-CM | POA: Diagnosis not present

## 2021-04-25 DIAGNOSIS — E782 Mixed hyperlipidemia: Secondary | ICD-10-CM | POA: Diagnosis not present

## 2021-04-25 DIAGNOSIS — I10 Essential (primary) hypertension: Secondary | ICD-10-CM | POA: Diagnosis not present

## 2021-05-06 IMAGING — CT CT HEAD W/O CM
3 series · 16 of 47 positions shown, 19 images · non-contrast
Comparison: 04/27/2020

CLINICAL DATA: Head trauma, fall

EXAM:
CT HEAD WITHOUT CONTRAST
TECHNIQUE: Contiguous axial images were obtained from the base of the skull
through the vertex without intravenous contrast.

[Series 2: head wo · axial · 0.40mm/px · z∈[-144,-19]mm · 10 of 31 slices shown, 13 images]
[im 3/31  brain]
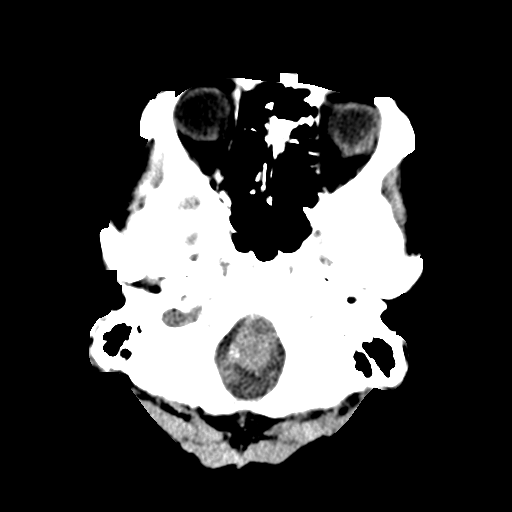
[im 3/31  bone]
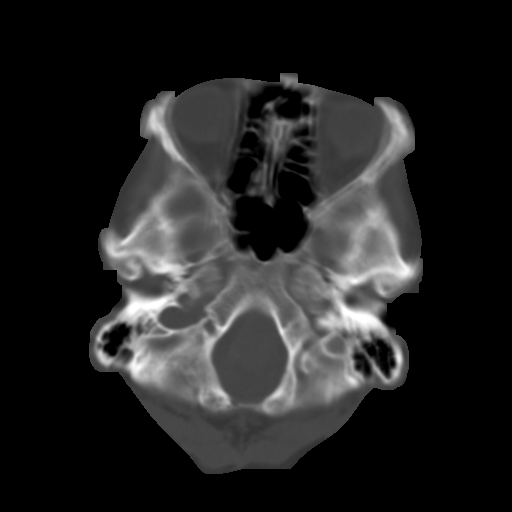
[im 6/31  brain]
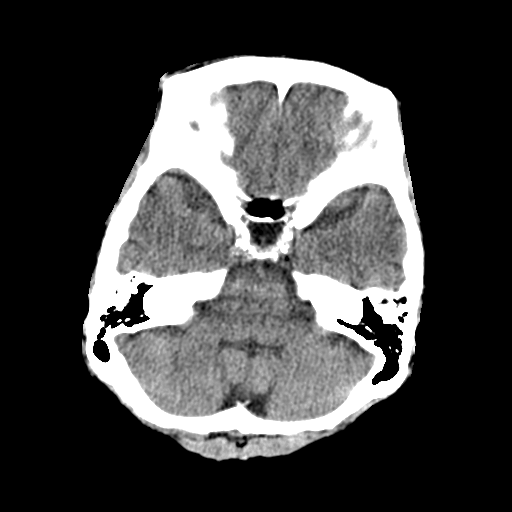
[im 9/31  brain]
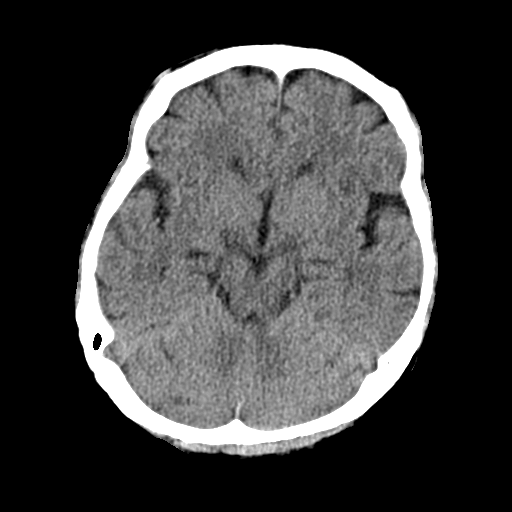
[im 11/31  brain]
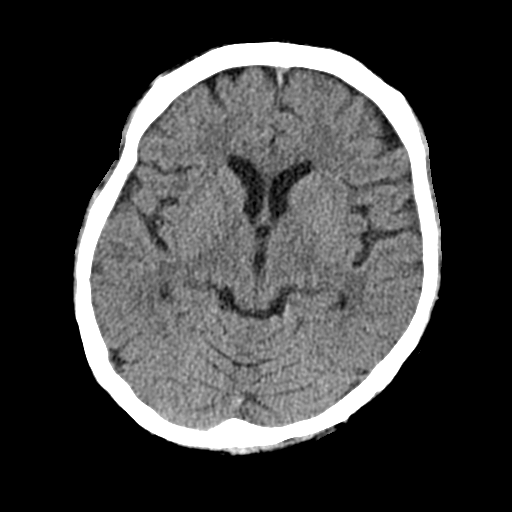
[im 14/31  brain]
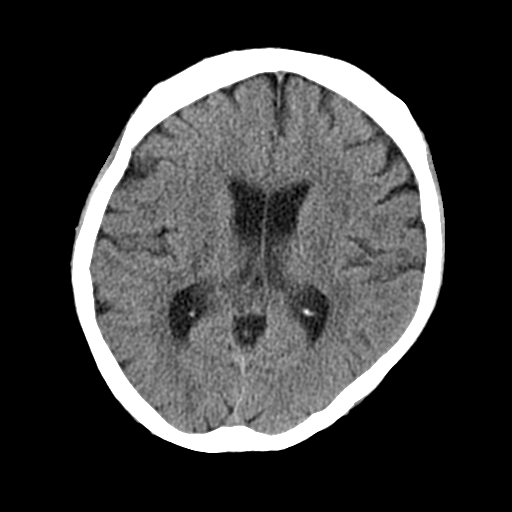
[im 14/31  bone]
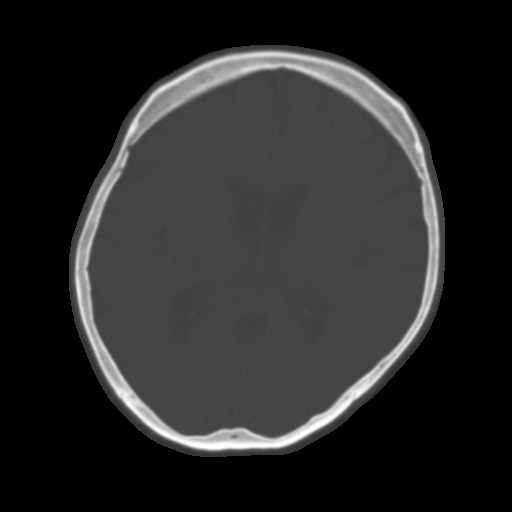
[im 17/31  brain]
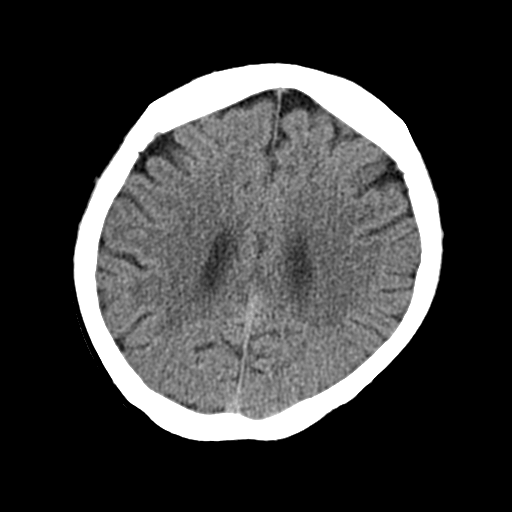
[im 20/31  brain]
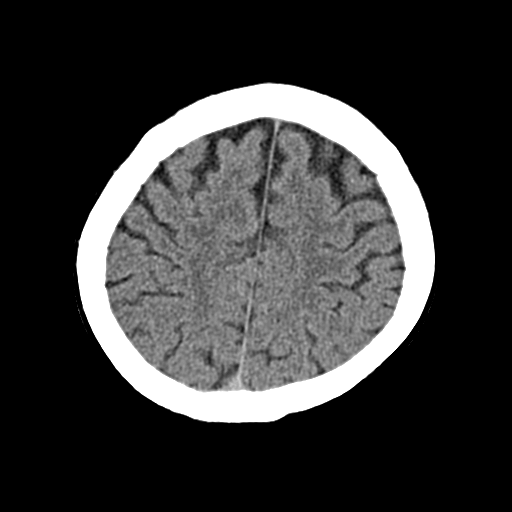
[im 23/31  brain]
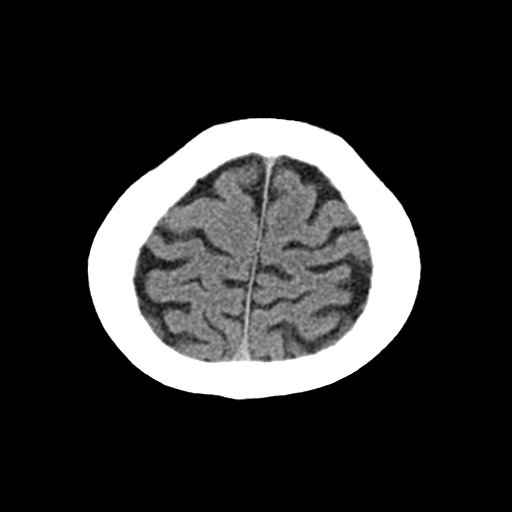
[im 25/31  brain]
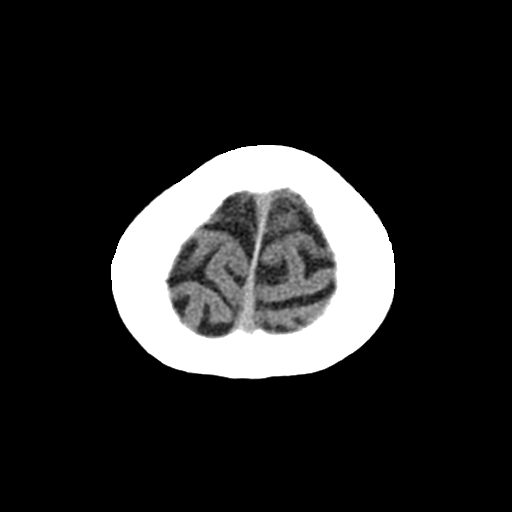
[im 25/31  bone]
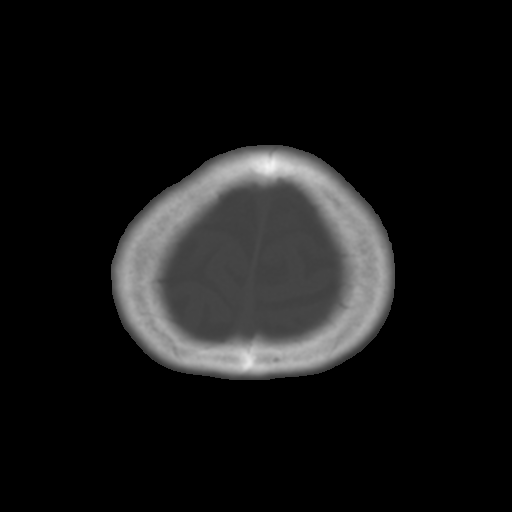
[im 28/31  brain]
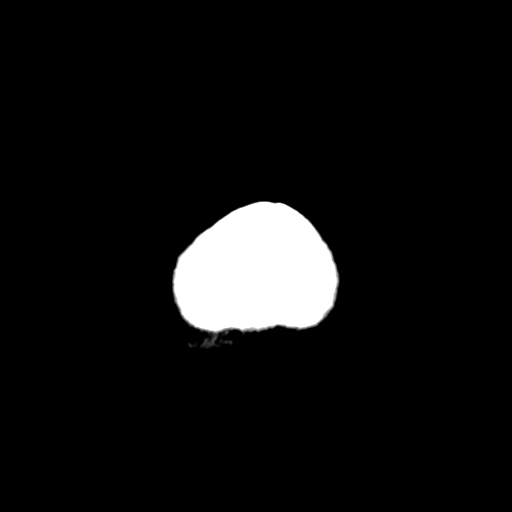

[Series 4: cor soft · coronal · 0.31mm/px · 3 of 60 slices shown]
[im 20/60  brain]
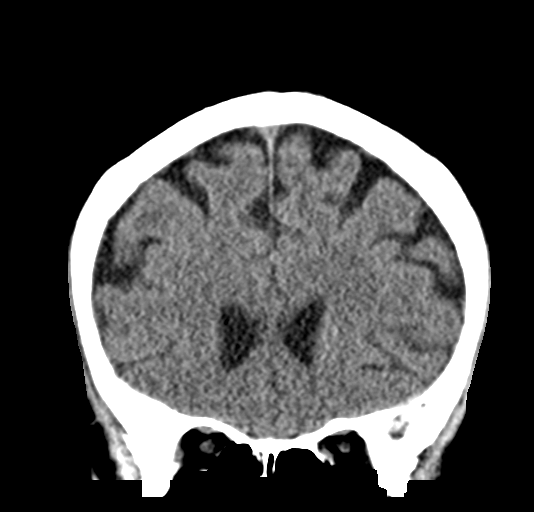
[im 27/60  brain]
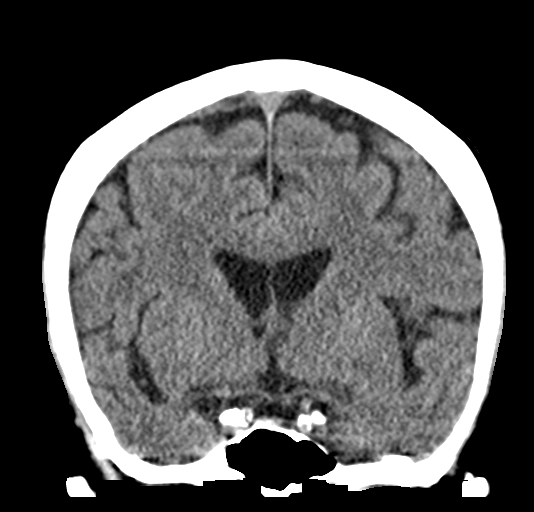
[im 33/60  brain]
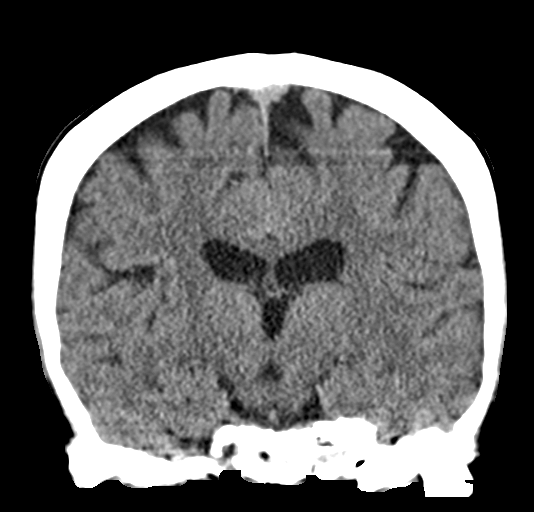

[Series 5: sag soft · sagittal · 0.30mm/px · 3 of 55 slices shown]
[im 19/55  brain]
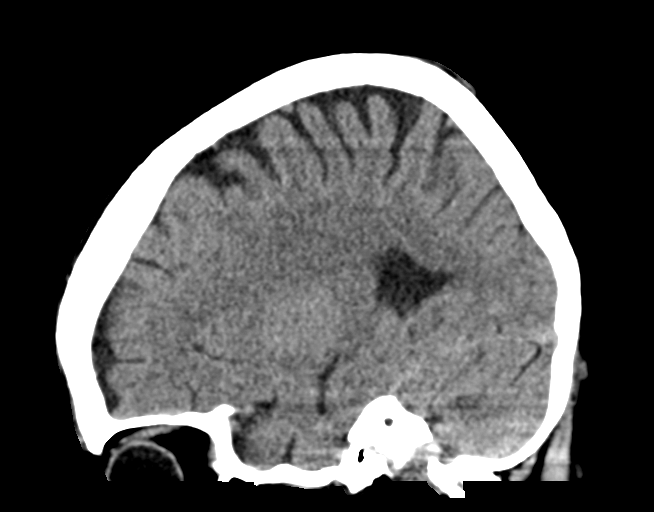
[im 28/55  brain]
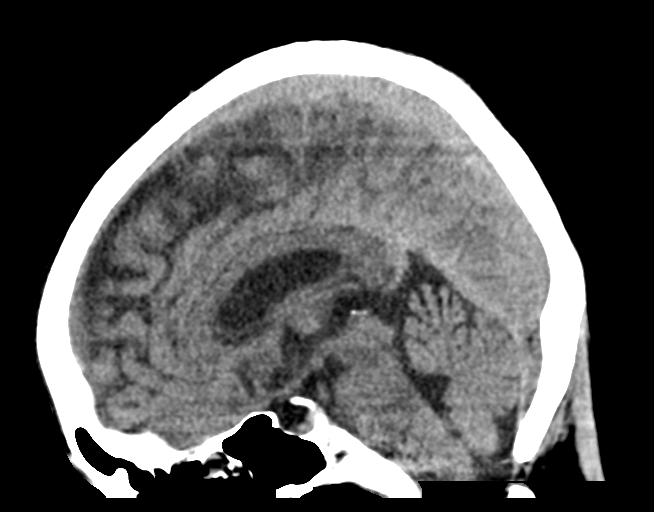
[im 37/55  brain]
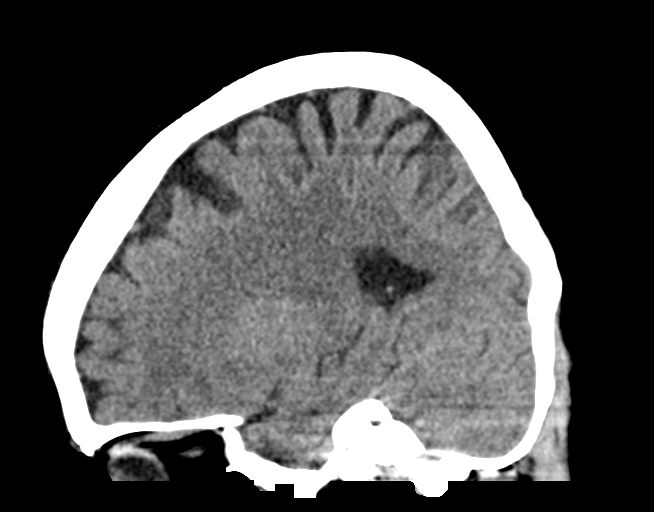

[16 of 47 positions shown; findings below may reference images not displayed]

FINDINGS: Brain: Normal anatomic configuration. No abnormal intra or
extra-axial mass lesion or fluid collection. No abnormal mass effect
or midline shift. No evidence of acute intracranial hemorrhage or
infarct. Ventricular size is normal. Cerebellum unremarkable.

Vascular: Unremarkable

Skull: Intact

Sinuses/Orbits: Paranasal sinuses are clear. Orbits are
unremarkable.

Other: Mastoid air cells and middle ear cavities are clear.
IMPRESSION: No acute intracranial abnormality.

## 2021-06-13 DIAGNOSIS — E1140 Type 2 diabetes mellitus with diabetic neuropathy, unspecified: Secondary | ICD-10-CM | POA: Diagnosis not present

## 2021-06-13 DIAGNOSIS — E1165 Type 2 diabetes mellitus with hyperglycemia: Secondary | ICD-10-CM | POA: Diagnosis not present

## 2021-09-20 DIAGNOSIS — H2523 Age-related cataract, morgagnian type, bilateral: Secondary | ICD-10-CM | POA: Diagnosis not present

## 2021-09-20 DIAGNOSIS — H04123 Dry eye syndrome of bilateral lacrimal glands: Secondary | ICD-10-CM | POA: Diagnosis not present

## 2021-09-20 DIAGNOSIS — H25043 Posterior subcapsular polar age-related cataract, bilateral: Secondary | ICD-10-CM | POA: Diagnosis not present

## 2022-01-04 DIAGNOSIS — H04123 Dry eye syndrome of bilateral lacrimal glands: Secondary | ICD-10-CM | POA: Diagnosis not present

## 2022-02-02 DIAGNOSIS — I10 Essential (primary) hypertension: Secondary | ICD-10-CM | POA: Diagnosis not present

## 2022-02-02 DIAGNOSIS — E782 Mixed hyperlipidemia: Secondary | ICD-10-CM | POA: Diagnosis not present

## 2022-02-02 DIAGNOSIS — E1142 Type 2 diabetes mellitus with diabetic polyneuropathy: Secondary | ICD-10-CM | POA: Diagnosis not present

## 2022-02-02 DIAGNOSIS — Z794 Long term (current) use of insulin: Secondary | ICD-10-CM | POA: Diagnosis not present

## 2022-02-05 DIAGNOSIS — I10 Essential (primary) hypertension: Secondary | ICD-10-CM | POA: Diagnosis not present

## 2022-02-05 DIAGNOSIS — J90 Pleural effusion, not elsewhere classified: Secondary | ICD-10-CM | POA: Diagnosis not present

## 2022-02-05 DIAGNOSIS — Z538 Procedure and treatment not carried out for other reasons: Secondary | ICD-10-CM | POA: Diagnosis not present

## 2022-02-05 DIAGNOSIS — H2513 Age-related nuclear cataract, bilateral: Secondary | ICD-10-CM | POA: Diagnosis not present

## 2022-02-08 DIAGNOSIS — E782 Mixed hyperlipidemia: Secondary | ICD-10-CM | POA: Diagnosis not present

## 2022-02-08 DIAGNOSIS — Z794 Long term (current) use of insulin: Secondary | ICD-10-CM | POA: Diagnosis not present

## 2022-02-08 DIAGNOSIS — R0989 Other specified symptoms and signs involving the circulatory and respiratory systems: Secondary | ICD-10-CM | POA: Diagnosis not present

## 2022-02-08 DIAGNOSIS — E1140 Type 2 diabetes mellitus with diabetic neuropathy, unspecified: Secondary | ICD-10-CM | POA: Diagnosis not present

## 2022-02-08 DIAGNOSIS — Z91199 Patient's noncompliance with other medical treatment and regimen due to unspecified reason: Secondary | ICD-10-CM | POA: Diagnosis not present

## 2022-02-08 DIAGNOSIS — E1142 Type 2 diabetes mellitus with diabetic polyneuropathy: Secondary | ICD-10-CM | POA: Diagnosis not present

## 2022-02-08 DIAGNOSIS — R600 Localized edema: Secondary | ICD-10-CM | POA: Diagnosis not present

## 2022-03-08 DIAGNOSIS — I509 Heart failure, unspecified: Secondary | ICD-10-CM | POA: Diagnosis not present

## 2022-06-01 ENCOUNTER — Other Ambulatory Visit: Payer: Self-pay | Admitting: Family

## 2022-06-01 DIAGNOSIS — N644 Mastodynia: Secondary | ICD-10-CM

## 2022-06-13 ENCOUNTER — Other Ambulatory Visit: Payer: 59

## 2022-07-09 DEATH — deceased
